# Patient Record
Sex: Male | Born: 1985 | Race: White | Hispanic: No | Marital: Single | Smoking: Current every day smoker
Health system: Southern US, Community
[De-identification: ages and names within clinical notes are randomized; demographics above are authoritative.]

## PROBLEM LIST (undated history)

## (undated) DIAGNOSIS — J45909 Unspecified asthma, uncomplicated: Secondary | ICD-10-CM

---

## 2006-02-11 ENCOUNTER — Emergency Department: Payer: Self-pay

## 2006-02-11 ENCOUNTER — Emergency Department: Payer: Self-pay | Admitting: Emergency Medicine

## 2006-02-20 ENCOUNTER — Emergency Department: Payer: Self-pay | Admitting: Emergency Medicine

## 2006-08-19 ENCOUNTER — Emergency Department: Payer: Self-pay | Admitting: Emergency Medicine

## 2006-08-30 ENCOUNTER — Emergency Department: Payer: Self-pay | Admitting: Emergency Medicine

## 2008-09-29 ENCOUNTER — Emergency Department: Payer: Self-pay | Admitting: Emergency Medicine

## 2008-10-29 ENCOUNTER — Emergency Department: Payer: Self-pay | Admitting: Emergency Medicine

## 2009-03-04 ENCOUNTER — Emergency Department: Payer: Self-pay | Admitting: Emergency Medicine

## 2009-10-12 ENCOUNTER — Emergency Department: Payer: Self-pay | Admitting: Emergency Medicine

## 2009-10-15 ENCOUNTER — Emergency Department: Payer: Self-pay | Admitting: Emergency Medicine

## 2009-10-18 ENCOUNTER — Emergency Department: Payer: Self-pay | Admitting: Internal Medicine

## 2009-10-18 ENCOUNTER — Inpatient Hospital Stay (HOSPITAL_COMMUNITY)
Admission: EM | Admit: 2009-10-18 | Discharge: 2009-10-20 | Payer: Self-pay | Source: Home / Self Care | Admitting: Emergency Medicine

## 2009-10-26 ENCOUNTER — Emergency Department: Payer: Self-pay | Admitting: Emergency Medicine

## 2010-03-09 ENCOUNTER — Emergency Department: Payer: Self-pay | Admitting: Emergency Medicine

## 2010-03-25 LAB — URINE MICROSCOPIC-ADD ON

## 2010-03-25 LAB — CBC
HCT: 36.5 % — ABNORMAL LOW (ref 39.0–52.0)
Hemoglobin: 12.5 g/dL — ABNORMAL LOW (ref 13.0–17.0)
Hemoglobin: 15.5 g/dL (ref 13.0–17.0)
MCH: 30.3 pg (ref 26.0–34.0)
MCV: 88 fL (ref 78.0–100.0)
RBC: 4.15 MIL/uL — ABNORMAL LOW (ref 4.22–5.81)
RBC: 5.11 MIL/uL (ref 4.22–5.81)
RDW: 12.3 % (ref 11.5–15.5)
WBC: 11.2 10*3/uL — ABNORMAL HIGH (ref 4.0–10.5)
WBC: 19.5 10*3/uL — ABNORMAL HIGH (ref 4.0–10.5)

## 2010-03-25 LAB — COMPREHENSIVE METABOLIC PANEL
ALT: 15 U/L (ref 0–53)
AST: 20 U/L (ref 0–37)
Albumin: 3.8 g/dL (ref 3.5–5.2)
Alkaline Phosphatase: 79 U/L (ref 39–117)
CO2: 25 mEq/L (ref 19–32)
Chloride: 105 mEq/L (ref 96–112)
GFR calc Af Amer: >60 mL/min (ref >60)
GFR calc non Af Amer: >60 mL/min (ref >60)
Potassium: 3.9 mEq/L (ref 3.5–5.1)
Sodium: 138 mEq/L (ref 135–145)
Total Bilirubin: 0.4 mg/dL (ref 0.3–1.2)

## 2010-03-25 LAB — URINE CULTURE
Culture  Setup Time: 201110092130
Culture: NO GROWTH

## 2010-03-25 LAB — CULTURE, BLOOD (ROUTINE X 2)
Culture: NO GROWTH
Culture: NO GROWTH

## 2010-03-25 LAB — SEDIMENTATION RATE: Sed Rate: 26 mm/hr — ABNORMAL HIGH (ref 0–16)

## 2010-03-25 LAB — BASIC METABOLIC PANEL
BUN: 7 mg/dL (ref 6–23)
Chloride: 111 mEq/L (ref 96–112)
GFR calc Af Amer: >60 mL/min (ref >60)
GFR calc non Af Amer: >60 mL/min (ref >60)
Potassium: 4.3 mEq/L (ref 3.5–5.1)
Sodium: 142 mEq/L (ref 135–145)

## 2010-03-25 LAB — URINALYSIS, ROUTINE W REFLEX MICROSCOPIC
Ketones, ur: NEGATIVE mg/dL
Nitrite: NEGATIVE
Specific Gravity, Urine: 1.008 (ref 1.005–1.030)
Urobilinogen, UA: 0.2 mg/dL (ref 0.0–1.0)
pH: 7 (ref 5.0–8.0)

## 2010-03-25 LAB — HLA-B27 ANTIGEN

## 2010-03-25 LAB — DIFFERENTIAL
Basophils Absolute: 0 10*3/uL (ref 0.0–0.1)
Eosinophils Absolute: 0.1 10*3/uL (ref 0.0–0.7)
Eosinophils Relative: 1 % (ref 0–5)
Monocytes Absolute: 1.5 10*3/uL — ABNORMAL HIGH (ref 0.1–1.0)

## 2010-03-25 LAB — URIC ACID: Uric Acid, Serum: 5.5 mg/dL (ref 4.0–7.8)

## 2010-09-22 ENCOUNTER — Emergency Department (HOSPITAL_COMMUNITY)
Admission: EM | Admit: 2010-09-22 | Discharge: 2010-09-22 | Disposition: A | Discharge status: Home / Self Care | Payer: Self-pay | Attending: Emergency Medicine | Admitting: Emergency Medicine

## 2010-09-22 DIAGNOSIS — R6883 Chills (without fever): Secondary | ICD-10-CM | POA: Insufficient documentation

## 2010-09-22 DIAGNOSIS — M79609 Pain in unspecified limb: Secondary | ICD-10-CM | POA: Insufficient documentation

## 2010-09-22 DIAGNOSIS — L03119 Cellulitis of unspecified part of limb: Secondary | ICD-10-CM | POA: Insufficient documentation

## 2010-09-22 DIAGNOSIS — R112 Nausea with vomiting, unspecified: Secondary | ICD-10-CM | POA: Insufficient documentation

## 2010-09-22 DIAGNOSIS — L02419 Cutaneous abscess of limb, unspecified: Secondary | ICD-10-CM | POA: Insufficient documentation

## 2010-09-22 DIAGNOSIS — R21 Rash and other nonspecific skin eruption: Secondary | ICD-10-CM | POA: Insufficient documentation

## 2011-08-18 ENCOUNTER — Emergency Department: Payer: Self-pay | Admitting: Emergency Medicine

## 2011-09-16 ENCOUNTER — Emergency Department: Payer: Self-pay | Admitting: Emergency Medicine

## 2011-09-16 IMAGING — CR RIGHT INDEX FINGER 2+V
1 series · 3 of 3 positions shown · non-contrast
Comparison: none

REASON FOR EXAM: laceration
COMMENTS:

PROCEDURE:     DXR - DXR FINGER INDEX 2ND DIGIT RT HA  - [DATE]  [DATE]
RESULT:     Comparison:  None

[Series 1: x finger pa right · 0.14mm/px · 3 of 3 slices shown]
[im 1/3]
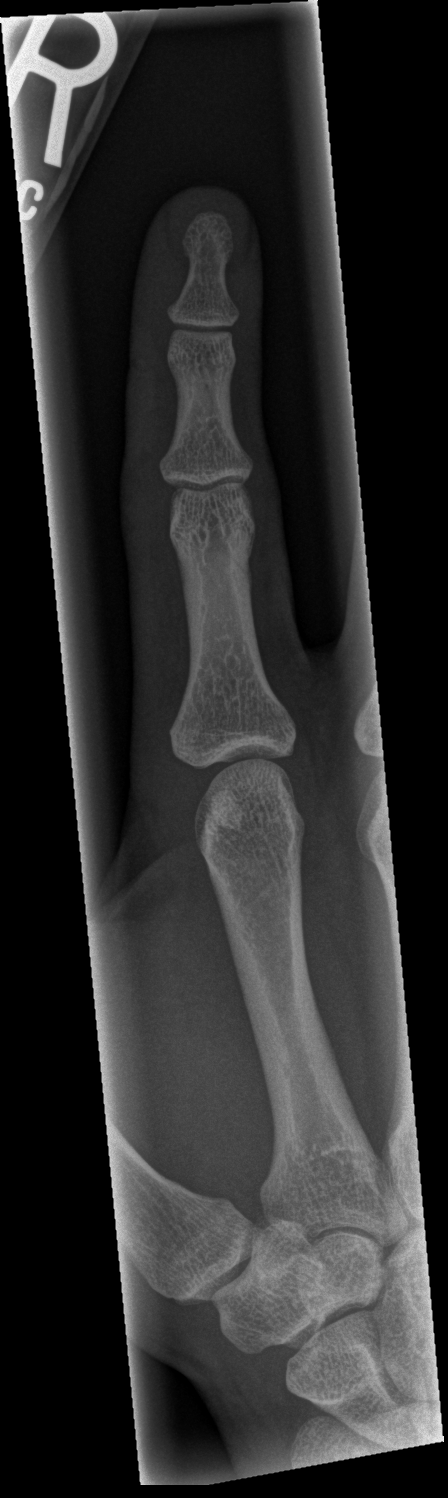
[im 2/3]
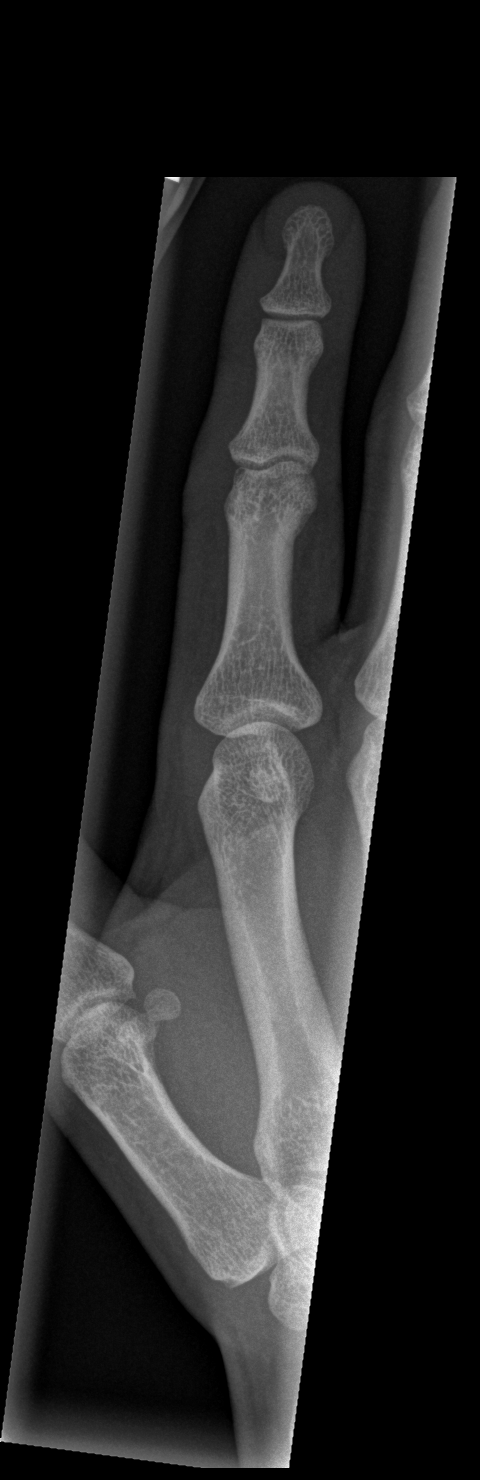
[im 3/3]
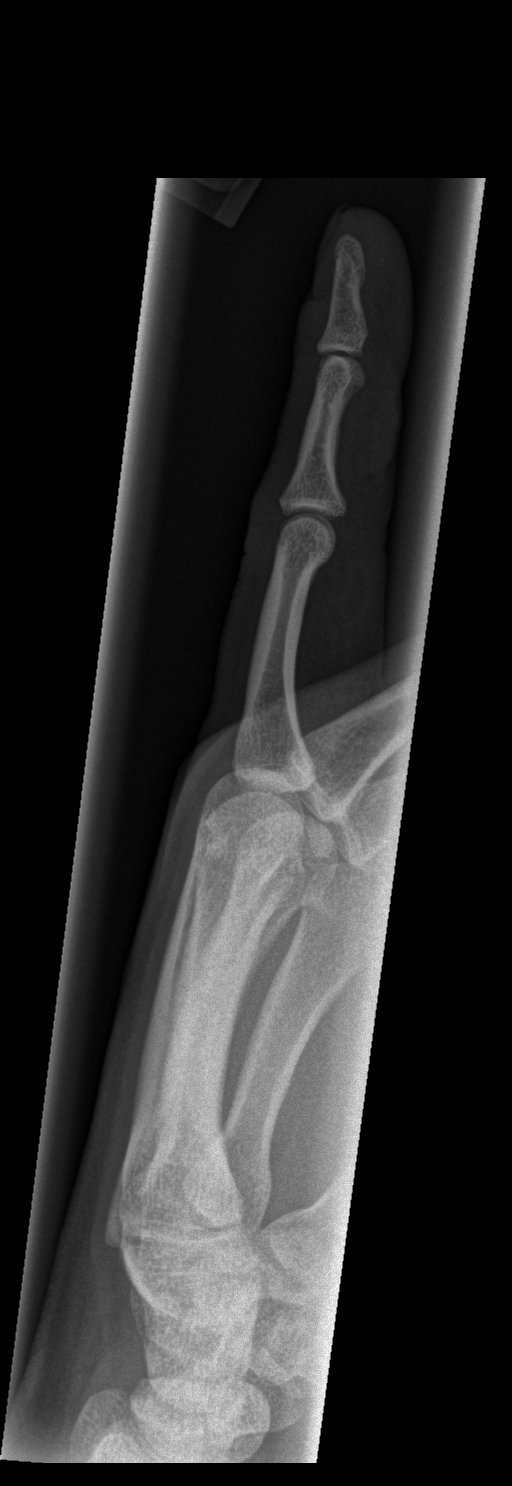

[3 of 3 positions shown; findings below may reference images not displayed]

FINDINGS: Three coned-down views of the right second digit demonstrates no fracture or
dislocation. The soft tissues are normal.
IMPRESSION: No acute osseous injury of the right second digit.

[REDACTED]

## 2014-05-31 ENCOUNTER — Encounter: Payer: Self-pay | Admitting: Emergency Medicine

## 2014-05-31 ENCOUNTER — Emergency Department: Payer: Self-pay

## 2014-05-31 ENCOUNTER — Emergency Department
Admission: EM | Admit: 2014-05-31 | Discharge: 2014-05-31 | Disposition: A | Discharge status: Home / Self Care | Payer: Self-pay | Attending: Emergency Medicine | Admitting: Emergency Medicine

## 2014-05-31 DIAGNOSIS — S62632A Displaced fracture of distal phalanx of right middle finger, initial encounter for closed fracture: Secondary | ICD-10-CM | POA: Insufficient documentation

## 2014-05-31 DIAGNOSIS — Y998 Other external cause status: Secondary | ICD-10-CM | POA: Insufficient documentation

## 2014-05-31 DIAGNOSIS — Z720 Tobacco use: Secondary | ICD-10-CM | POA: Insufficient documentation

## 2014-05-31 DIAGNOSIS — W450XXA Nail entering through skin, initial encounter: Secondary | ICD-10-CM

## 2014-05-31 DIAGNOSIS — S6710XA Crushing injury of unspecified finger(s), initial encounter: Secondary | ICD-10-CM

## 2014-05-31 DIAGNOSIS — S61319A Laceration without foreign body of unspecified finger with damage to nail, initial encounter: Secondary | ICD-10-CM

## 2014-05-31 DIAGNOSIS — S61312A Laceration without foreign body of right middle finger with damage to nail, initial encounter: Secondary | ICD-10-CM | POA: Insufficient documentation

## 2014-05-31 DIAGNOSIS — Y9289 Other specified places as the place of occurrence of the external cause: Secondary | ICD-10-CM | POA: Insufficient documentation

## 2014-05-31 DIAGNOSIS — S62639A Displaced fracture of distal phalanx of unspecified finger, initial encounter for closed fracture: Secondary | ICD-10-CM

## 2014-05-31 DIAGNOSIS — W208XXA Other cause of strike by thrown, projected or falling object, initial encounter: Secondary | ICD-10-CM | POA: Insufficient documentation

## 2014-05-31 DIAGNOSIS — Y9389 Activity, other specified: Secondary | ICD-10-CM | POA: Insufficient documentation

## 2014-05-31 HISTORY — DX: Unspecified asthma, uncomplicated: J45.909

## 2014-05-31 MED ORDER — LIDOCAINE HCL (PF) 1 % IJ SOLN
2.0000 mL | Freq: Once | INTRAMUSCULAR | Status: AC
  Administered 2014-05-31: 2 mL via INTRADERMAL

## 2014-05-31 MED ORDER — HYDROCODONE-ACETAMINOPHEN 5-325 MG PO TABS
1.0000 | ORAL_TABLET | Freq: Once | ORAL | Status: AC
  Administered 2014-05-31: 1 via ORAL

## 2014-05-31 MED ORDER — AMOXICILLIN-POT CLAVULANATE 875-125 MG PO TABS: 1.0000 | ORAL_TABLET | Freq: Two times a day (BID) | ORAL | Status: AC

## 2014-05-31 MED ORDER — LIDOCAINE HCL (PF) 1 % IJ SOLN
INTRAMUSCULAR | Status: AC
  Administered 2014-05-31: 2 mL via INTRADERMAL
  Filled 2014-05-31: qty 5

## 2014-05-31 MED ORDER — HYDROCODONE-ACETAMINOPHEN 5-325 MG PO TABS
ORAL_TABLET | ORAL | Status: AC
  Administered 2014-05-31: 1 via ORAL
  Filled 2014-05-31: qty 1

## 2014-05-31 MED ORDER — HYDROCODONE-ACETAMINOPHEN 5-325 MG PO TABS: 1.0000 | ORAL_TABLET | ORAL | Status: DC | PRN

## 2014-05-31 NOTE — Discharge Instructions (Signed)
Crush Injury, Fingers or Toes A crush injury to the fingers or toes means the tissues have been damaged by being squeezed (compressed). There will be bleeding into the tissues and swelling. Often, blood will collect under the skin. When this happens, the skin on the finger often dies and may slough off (shed) 1 week to 10 days later. Usually, new skin is growing underneath. If the injury has been too severe and the tissue does not survive, the damaged tissue may begin to turn black over several days.  Wounds which occur because of the crushing may be stitched (sutured) shut. However, crush injuries are more likely to become infected than other injuries.These wounds may not be closed as tightly as other types of cuts to prevent infection. Nails involved are often lost. These usually grow back over several weeks.  DIAGNOSIS X-rays may be taken to see if there is any injury to the bones. TREATMENT Broken bones (fractures) may be treated with splinting, depending on the fracture. Often, no treatment is required for fractures of the last bone in the fingers or toes. HOME CARE INSTRUCTIONS   The crushed part should be raised (elevated) above the heart or center of the chest as much as possible for the first several days or as directed. This helps with pain and lessens swelling. Less swelling increases the chances that the crushed part will survive.  Put ice on the injured area.  Put ice in a plastic bag.  Place a towel between your skin and the bag.  Leave the ice on for 15-20 minutes, 03-04 times a day for the first 2 days.  Only take over-the-counter or prescription medicines for pain, discomfort, or fever as directed by your caregiver.  Use your injured part only as directed.  Change your bandages (dressings) as directed.  Keep all follow-up appointments as directed by your caregiver. Not keeping your appointment could result in a chronic or permanent injury, pain, and disability. If there is  any problem keeping the appointment, you must call to reschedule. SEEK IMMEDIATE MEDICAL CARE IF:   There is redness, swelling, or increasing pain in the wound area.  Pus is coming from the wound.  You have a fever.  You notice a bad smell coming from the wound or dressing.  The edges of the wound do not stay together after the sutures have been removed.  You are unable to move the injured finger or toe. MAKE SURE YOU:   Understand these instructions.  Will watch your condition.  Will get help right away if you are not doing well or get worse. Document Released: 12/27/2004 Document Revised: 03/21/2011 Document Reviewed: 05/14/2010 [facility] Patient Information 2015 [place], [place]. This information is not intended to replace advice given to you by your health care provider. Make sure you discuss any questions you have with your health care provider.  Fingernail Removal Fingernails may need to be removed because of injury, infections, or correction of abnormal growth. A special non-stick bandage has been put on your finger tightly to prevent bleeding. Fingernails will usually grow back if the finger has not been badly injured and you carefully follow instructions. HOME CARE INSTRUCTIONS   Keep your hand elevated above your heart to relieve pain and swelling.  Keep your dressing dry and clean.  Change your bandage in 24 hours.  After your bandage is changed, soak your hand in warm soapy water for 10 to 20 minutes. Do this 3 times per day. This helps reduce pain and  swelling. After soaking your hand, apply a clean, dry bandage. Change your bandage if it is wet or dirty.  Only take over-the-counter or prescription medicines for pain, discomfort, or fever as directed by your caregiver.  See your caregiver as needed for problems.  You may have received an instruction to follow up with your caregiver or a specialist. The failure to follow up as instructed could result in the permanent  loss of a fingernail. SEEK IMMEDIATE MEDICAL CARE IF:   You have increased pain, swelling, drainage, or bleeding.  You have a fever. MAKE SURE YOU:   Understand these instructions.  Will watch your condition.  Will get help right away if you are not doing well or get worse. Document Released: 12/25/1999 Document Revised: 03/21/2011 Document Reviewed: 05/01/2007 [facility] Patient Information 2015 [place], [place]. This information is not intended to replace advice given to you by your health care provider. Make sure you discuss any questions you have with your health care provider.

## 2014-05-31 NOTE — ED Notes (Signed)
Patient to ED with c/o mashing right 3rd digit with wood yesterday afternoon. Patient reports continued bleeding and nail is barely hanging on. Patient also c/o possible abscess to right upper jaw.

## 2014-05-31 NOTE — ED Provider Notes (Signed)
CSN: [id number]     Arrival date & time 05/31/14  1817 History   First MD Initiated Contact with Patient 05/31/14 1944     Chief Complaint  Patient presents with  . Finger Injury     (Consider location/radiation/quality/duration/timing/severity/associated sxs/prior Treatment) HPI 29 year old male presents for evaluation of right third digit pain. At approximately 11:50 AM 1 day ago, patient had a heavy piece of wood fall and land on his right third digit. Patient dressed the tip of the right third digit with a gauze and presents today for evaluation. The patient has moderate pain to the dorsal aspect of the right third digit. Mild laceration at the base of the nail. The proximal nail is elevated out of the nail bed. Patient has been keeping the digit clean and dry. He states he did not come in sooner due to graduation yesterday. His tetanus is up-to-date, less than 5 years. He denies any numbness or tingling.   Past Medical History  Diagnosis Date  . Asthma    History reviewed. No pertinent past surgical history. History reviewed. No pertinent family history. History  Substance Use Topics  . Smoking status: Current Every Day Smoker  . Smokeless tobacco: Not on file  . Alcohol Use: Not on file    Review of Systems  Constitutional: Negative.  Negative for fever, chills, activity change and appetite change.  HENT: Negative for congestion, ear pain, mouth sores, rhinorrhea, sinus pressure, sore throat and trouble swallowing.   Eyes: Negative for photophobia, pain and discharge.  Respiratory: Negative for cough, chest tightness and shortness of breath.   Cardiovascular: Negative for chest pain and leg swelling.  Gastrointestinal: Negative for nausea, vomiting, abdominal pain, diarrhea and abdominal distention.  Genitourinary: Negative for dysuria and difficulty urinating.  Musculoskeletal: Positive for joint swelling. Negative for back pain, arthralgias and gait problem.  Skin:  Positive for wound. Negative for color change and rash.  Neurological: Negative for dizziness and headaches.  Hematological: Negative for adenopathy.  Psychiatric/Behavioral: Negative for behavioral problems and agitation.      Allergies  Review of patient's allergies indicates no known allergies.  Home Medications   Prior to Admission medications   Medication Sig Start Date End Date Taking? Authorizing Provider  amoxicillin-clavulanate (AUGMENTIN) 875-125 MG per tablet Take 1 tablet by mouth every 12 (twelve) hours. 05/31/14 06/10/14  Duanne Guess, PA-C  HYDROcodone-acetaminophen (NORCO) 5-325 MG per tablet Take 1 tablet by mouth every 4 (four) hours as needed for moderate pain. 05/31/14   Duanne Guess, PA-C   BP 138/73 mmHg  Pulse 63  Temp(Src) 97.7 F (36.5 C) (Oral)  Resp 18  Ht 5\' 10"  (1.778 m)  Wt 195 lb (88.451 kg)  BMI 27.98 kg/m2  SpO2 100% Physical Exam  Constitutional: He is oriented to person, place, and time. He appears well-developed and well-nourished.  HENT:  Head: Normocephalic and atraumatic.  Eyes: Conjunctivae and EOM are normal. Pupils are equal, round, and reactive to light.  Neck: Normal range of motion. Neck supple.  Cardiovascular: Normal rate, regular rhythm, normal heart sounds and intact distal pulses.   Pulmonary/Chest: Effort normal and breath sounds normal.  Abdominal: Soft. Bowel sounds are normal.  Musculoskeletal: Normal range of motion. He exhibits tenderness (dorsal aspect of right third distal phalanx). He exhibits no edema.  Neurological: He is alert and oriented to person, place, and time. No cranial nerve deficit.  Skin: Skin is warm and dry.  Laceration at the base of the nail right  third digit. This is non-gaping. Superficial. Approximately one centimeters. Proximal ulnar aspect of the base of the nail is protruding. Mild subungual hematoma present to the right third nail  Psychiatric: He has a normal mood and affect. His behavior  is normal. Judgment and thought content normal.    ED Course  Procedures (including critical care time) NERVE BLOCK Performed by: Feliberto Gottron Consent: Verbal consent obtained. Required items: required blood products, implants, devices, and special equipment available Time out: Immediately prior to procedure a "time out" was called to verify the correct patient, procedure, equipment, support staff and site/side marked as required.  Indication:  Nail repair right third digit  Nerve block body site: Right third digit   Preparation: Patient was prepped and draped in the usual sterile fashion. Needle gauge: 24 G Location technique: anatomical landmarks  Local anesthetic: 1% lidocaine without epi   Anesthetic total: 2 ml  Outcome: pain improved Patient tolerance: Patient tolerated the procedure well with no immediate complications.  LACERATION REPAIR Performed by: Feliberto Gottron Authorized by: Feliberto Gottron Consent: Verbal consent obtained. Risks and benefits: risks, benefits and alternatives were discussed Consent given by: patient Patient identity confirmed: provided demographic data Prepped and Draped in normal sterile fashion Wound explored  Laceration Location: Right third nail bed  Laceration Length: 2 cm   No Foreign Bodies seen or palpated  Anesthesia: local infiltration  Local anesthetic: lidocaine 1% % without epinephrine digital block Anesthetic total: 2 ml  Irrigation method: syringe Amount of cleaning: standard  Skin closure: 2 4-0 Vicryl   Number of sutures: 4-0 Vicryl Technique:  simple interrupted   Patient tolerance: Patient tolerated the procedure well with no immediate complications. patient's nail was initially partially removed, this was fully removed during the procedure. It was necessary to remove the nail to repair the nail bed.   Labs Review Labs Reviewed - No data to display  Imaging Review Dg  Finger Middle Right  05/31/2014   CLINICAL DATA:  Trauma to distal 3rd digit  EXAM: RIGHT MIDDLE FINGER 2+V  COMPARISON:  None.  FINDINGS: Comminuted 3rd distal tuft fracture.  The joint spaces are preserved.  Mild soft tissue swelling.  IMPRESSION: Comminuted 3rd distal tuft fracture.   Electronically Signed   By: Julian Hy M.D.   On: 05/31/2014 20:19     EKG Interpretation None      MDM   Final diagnoses:  Crush injury to finger, initial encounter  Nail, injury by, initial encounter  Closed fracture of tuft of distal phalanx of finger, initial encounter  Laceration of nail bed of finger, initial encounter     29 year old male status post right third digit crush injury one day ago. He suffered a laceration and partial nail removal to the right third digit. X-ray showed tuft fracture with minimal displacement. Patient underwent digital block, nail removal, laceration of the nailbed. His tetanus is up-to-date. He was started on Augmentin. Dressing was applied. He will follow-up with orthopedics in 7 days for recheck.    Duanne Guess, PA-C 05/31/14 2047  Duanne Guess, PA-C 05/31/14 2048  Orbie Pyo, MD 06/01/14 [phone]

## 2014-05-31 NOTE — ED Notes (Addendum)
Pt reports a log rolled and crushed his right third finger against the bed of the truck yesterday. Finger nail remains intact but swelling present under the nail and cuticle causing tissue under the nail to push between the cuticle and the nail.

## 2015-04-15 ENCOUNTER — Emergency Department
Admission: EM | Admit: 2015-04-15 | Discharge: 2015-04-15 | Disposition: A | Discharge status: Home / Self Care | Payer: Self-pay | Attending: Emergency Medicine | Admitting: Emergency Medicine

## 2015-04-15 ENCOUNTER — Encounter: Payer: Self-pay | Admitting: Medical Oncology

## 2015-04-15 DIAGNOSIS — F17200 Nicotine dependence, unspecified, uncomplicated: Secondary | ICD-10-CM | POA: Insufficient documentation

## 2015-04-15 DIAGNOSIS — J45909 Unspecified asthma, uncomplicated: Secondary | ICD-10-CM | POA: Insufficient documentation

## 2015-04-15 DIAGNOSIS — J069 Acute upper respiratory infection, unspecified: Secondary | ICD-10-CM | POA: Insufficient documentation

## 2015-04-15 DIAGNOSIS — Z20828 Contact with and (suspected) exposure to other viral communicable diseases: Secondary | ICD-10-CM

## 2015-04-15 MED ORDER — CHLORPHENIRAMINE MALEATE 4 MG PO TABS: 4.0000 mg | ORAL_TABLET | Freq: Two times a day (BID) | ORAL | Status: DC | PRN

## 2015-04-15 MED ORDER — OSELTAMIVIR PHOSPHATE 75 MG PO CAPS: 75.0000 mg | ORAL_CAPSULE | Freq: Every day | ORAL | Status: DC

## 2015-04-15 NOTE — ED Provider Notes (Signed)
[facility] Emergency Department Provider Note  ____________________________________________  Time seen: Approximately 9:45 AM  I have reviewed the triage vital signs and the nursing notes.   HISTORY  Chief Complaint Cough and Sore Throat    HPI MUSTAF FOUTCH is a 30 y.o. male presents with complaints of runny nose and throat and cough since yesterday. Patient denies any fever chills nausea vomiting. States appetite okay. Has not taken any over-the-counter medications treat the symptoms. Daughter diagnosed with a positive flu and is started on Tamiflu. Patient would like to start Tamiflu prophylactically since with runny nose. denies any other complaints at this time.   Past Medical History  Diagnosis Date  . Asthma     There are no active problems to display for this patient.   History reviewed. No pertinent past surgical history.  Current Outpatient Rx  Name  Route  Sig  Dispense  Refill  . chlorpheniramine (CHLOR-TRIMETON) 4 MG tablet   Oral   Take 1 tablet (4 mg total) by mouth 2 (two) times daily as needed for allergies or rhinitis.   30 tablet   0   . oseltamivir (TAMIFLU) 75 MG capsule   Oral   Take 1 capsule (75 mg total) by mouth daily.   10 capsule   0     Allergies Review of patient's allergies indicates no known allergies.  No family history on file.  Social History Social History  Substance Use Topics  . Smoking status: Current Every Day Smoker  . Smokeless tobacco: None  . Alcohol Use: No    Review of Systems Constitutional: No fever/chills ENT: Positive and cheek, sore throat. Cardiovascular: Denies chest pain. Respiratory: Denies shortness of breath. Positive for cough. Musculoskeletal: Negative for back pain. Skin: Negative for rash. Neurological: Negative for headaches, focal weakness or numbness.  10-point ROS otherwise negative.  ____________________________________________   PHYSICAL EXAM:  VITAL  SIGNS: ED Triage Vitals  Enc Vitals Group     BP 04/15/15 0939 119/61 mmHg     Pulse Rate 04/15/15 0939 60     Resp 04/15/15 0939 16     Temp 04/15/15 0939 98 F (36.7 C)     Temp Source 04/15/15 0939 Oral     SpO2 04/15/15 0939 100 %     Weight 04/15/15 0939 170 lb (77.111 kg)     Height 04/15/15 0939 5\' 10"  (1.778 m)     Head Cir --      Peak Flow --      Pain Score --      Pain Loc --      Pain Edu? --      Excl. in [place]? --     Constitutional: Alert and oriented. Well appearing and in no acute distress. Head: Atraumatic. Nose: Positive congestion/rhinnorhea. Mouth/Throat: Mucous membranes are moist.  Oropharynx non-erythematous. Neck: No stridor. No adenopathy, full range of motion nontender.  Cardiovascular: Normal rate, regular rhythm. Grossly normal heart sounds.  Good peripheral circulation. Respiratory: Normal respiratory effort.  No retractions. Lungs CTAB. Musculoskeletal: No lower extremity tenderness nor edema.  No joint effusions. Neurologic:  Normal speech and language. No gross focal neurologic deficits are appreciated. No gait instability. Skin:  Skin is warm, dry and intact. No rash noted. Psychiatric: Mood and affect are normal. Speech and behavior are normal.  ____________________________________________   LABS (all labs ordered are listed, but only abnormal results are displayed)  Labs Reviewed - No data to display ____________________________________________   PROCEDURES  Procedure(s) performed: None  Critical Care performed: No  ____________________________________________   INITIAL IMPRESSION / ASSESSMENT AND PLAN / ED COURSE  Pertinent labs & imaging results that were available during my care of the patient were reviewed by me and considered in my medical decision making (see chart for details).  Upper respiratory infection with exposure to influenza. Rx given for Tamiflu 75 mg , chlorpheniramine. Patient follow-up PCP or return to ER with  any worsening symptomology.  ____________________________________________   FINAL CLINICAL IMPRESSION(S) / ED DIAGNOSES  Final diagnoses:  URI, acute  Exposure to influenza     This chart was dictated using voice recognition software/Dragon. Despite best efforts to proofread, errors can occur which can change the meaning. Any change was purely unintentional.   Arlyss Repress, PA-C 04/15/15 1000

## 2015-04-15 NOTE — Discharge Instructions (Signed)
Viral Infections °A viral infection can be caused by different types of viruses. Most viral infections are not serious and resolve on their own. However, some infections may cause severe symptoms and may lead to further complications. °SYMPTOMS °Viruses can frequently cause: °· Minor sore throat. °· Aches and pains. °· Headaches. °· Runny nose. °· Different types of rashes. °· Watery eyes. °· Tiredness. °· Cough. °· Loss of appetite. °· Gastrointestinal infections, resulting in nausea, vomiting, and diarrhea. °These symptoms do not respond to antibiotics because the infection is not caused by bacteria. However, you might catch a bacterial infection following the viral infection. This is sometimes called a "superinfection." Symptoms of such a bacterial infection may include: °· Worsening sore throat with pus and difficulty swallowing. °· Swollen neck glands. °· Chills and a high or persistent fever. °· Severe headache. °· Tenderness over the sinuses. °· Persistent overall ill feeling (malaise), muscle aches, and tiredness (fatigue). °· Persistent cough. °· Yellow, green, or brown mucus production with coughing. °HOME CARE INSTRUCTIONS  °· Only take over-the-counter or prescription medicines for pain, discomfort, diarrhea, or fever as directed by your caregiver. °· Drink enough water and fluids to keep your urine clear or pale yellow. Sports drinks can provide valuable electrolytes, sugars, and hydration. °· Get plenty of rest and maintain proper nutrition. Soups and broths with crackers or rice are fine. °SEEK IMMEDIATE MEDICAL CARE IF:  °· You have severe headaches, shortness of breath, chest pain, neck pain, or an unusual rash. °· You have uncontrolled vomiting, diarrhea, or you are unable to keep down fluids. °· You or your child has an oral temperature above 102° F (38.9° C), not controlled by medicine. °· Your baby is older than 3 months with a rectal temperature of 102° F (38.9° C) or higher. °· Your baby is 3  months old or younger with a rectal temperature of 100.4° F (38° C) or higher. °MAKE SURE YOU:  °· Understand these instructions. °· Will watch your condition. °· Will get help right away if you are not doing well or get worse. °  °This information is not intended to replace advice given to you by your health care provider. Make sure you discuss any questions you have with your health care provider. °  °Document Released: 10/06/2004 Document Revised: 03/21/2011 Document Reviewed: 06/04/2014 °Elsevier Interactive Patient Education ©2016 [employer] ° °

## 2015-04-15 NOTE — ED Notes (Signed)
Pt reports runny nose, itchy throat and cough since yesterday.

## 2015-04-15 NOTE — ED Notes (Signed)
See triage note   Runny nose   Scratchy throat and cough since yesterday  Afebrile on arrival to ed

## 2016-06-22 DIAGNOSIS — Y990 Civilian activity done for income or pay: Secondary | ICD-10-CM | POA: Insufficient documentation

## 2016-06-22 DIAGNOSIS — W208XXA Other cause of strike by thrown, projected or falling object, initial encounter: Secondary | ICD-10-CM | POA: Insufficient documentation

## 2016-06-22 DIAGNOSIS — F17200 Nicotine dependence, unspecified, uncomplicated: Secondary | ICD-10-CM | POA: Insufficient documentation

## 2016-06-22 DIAGNOSIS — Y9259 Other trade areas as the place of occurrence of the external cause: Secondary | ICD-10-CM | POA: Insufficient documentation

## 2016-06-22 DIAGNOSIS — T1502XA Foreign body in cornea, left eye, initial encounter: Secondary | ICD-10-CM | POA: Insufficient documentation

## 2016-06-22 DIAGNOSIS — Y9389 Activity, other specified: Secondary | ICD-10-CM | POA: Insufficient documentation

## 2016-06-22 NOTE — ED Notes (Signed)
Pt states that he received an eye injury Yesterday at work and would like to file a workers comp claim but "I don't know my boss's number because I just started" First Nurse Lattie Haw) notified and attempted to find pt employer's WC profile but was unsuccessful, FN Lattie Haw completed wc ineligibility form and provided pt with it.

## 2016-06-22 NOTE — ED Notes (Signed)
Both eyes: 20/30 Left eye: 20/40 Right eye: 20/50

## 2016-06-22 NOTE — ED Triage Notes (Signed)
Pt presents to ED via POV with c/o LEFT eye injury with possible foreign body that happened yesterday. 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 Pt reports pain is currently 7/10. Pt does not wear contacts.

## 2016-06-23 ENCOUNTER — Emergency Department
Admission: EM | Admit: 2016-06-23 | Discharge: 2016-06-23 | Disposition: A | Discharge status: Home / Self Care | Payer: Self-pay | Attending: Student in an Organized Health Care Education/Training Program | Admitting: Student in an Organized Health Care Education/Training Program

## 2016-06-23 DIAGNOSIS — H5712 Ocular pain, left eye: Secondary | ICD-10-CM

## 2016-06-23 DIAGNOSIS — H18892 Other specified disorders of cornea, left eye: Secondary | ICD-10-CM

## 2016-06-23 MED ORDER — FLUORESCEIN SODIUM 0.6 MG OP STRP
1.0000 | ORAL_STRIP | Freq: Once | OPHTHALMIC | Status: AC
  Administered 2016-06-23: 1 via OPHTHALMIC

## 2016-06-23 MED ORDER — HYDROCODONE-ACETAMINOPHEN 5-325 MG PO TABS: 1.0000 | ORAL_TABLET | ORAL | 0 refills | Status: DC | PRN

## 2016-06-23 MED ORDER — TETANUS-DIPHTH-ACELL PERTUSSIS 5-2.5-18.5 LF-MCG/0.5 IM SUSP
0.5000 mL | Freq: Once | INTRAMUSCULAR | Status: AC
  Administered 2016-06-23: 0.5 mL via INTRAMUSCULAR

## 2016-06-23 MED ORDER — POLYMYXIN B-TRIMETHOPRIM 10000-0.1 UNIT/ML-% OP SOLN
1.0000 [drp] | OPHTHALMIC | Status: DC
  Administered 2016-06-23: 1 [drp] via OPHTHALMIC
  Filled 2016-06-23: qty 10

## 2016-06-23 MED ORDER — BACITRACIN-POLYMYXIN B 500-10000 UNIT/GM OP OINT: 1.0000 "application " | TOPICAL_OINTMENT | Freq: Three times a day (TID) | OPHTHALMIC | 0 refills | Status: DC

## 2016-06-23 MED ORDER — TETRACAINE HCL 0.5 % OP SOLN
2.0000 [drp] | Freq: Once | OPHTHALMIC | Status: AC
  Administered 2016-06-23: 2 [drp] via OPHTHALMIC

## 2016-06-23 MED ORDER — BACITRACIN-POLYMYXIN B 500-10000 UNIT/GM OP OINT: TOPICAL_OINTMENT | Freq: Two times a day (BID) | OPHTHALMIC | Status: DC

## 2016-06-23 NOTE — Discharge Instructions (Signed)
Please call for appt with ophthalmology tomorrow AM for removal of remainder of rust ring.

## 2016-06-23 NOTE — ED Provider Notes (Signed)
[facility] Emergency Department Provider Note    First MD Initiated Contact with Patient 06/23/16 0250     (approximate)  I have reviewed the triage vital signs and the nursing notes.   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 States he thinks that he was grinding some aluminum yesterday and got a speck in his left eye. States that he didn't feel any initial pain but has had worsening pain today and noted redness. States he was wearing protective glasses. Denies any fevers. No other injury or pain.   Past Medical History:  Diagnosis Date  . Asthma    No family history on file. History reviewed. No pertinent surgical history. There are no active problems to display for this patient.     Prior to Admission medications   Medication Sig Start Date End Date Taking? Authorizing Provider  chlorpheniramine (CHLOR-TRIMETON) 4 MG tablet Take 1 tablet (4 mg total) by mouth 2 (two) times daily as needed for allergies or rhinitis. 04/15/15   Beers, Pierce Crane, PA-C  oseltamivir (TAMIFLU) 75 MG capsule Take 1 capsule (75 mg total) by mouth daily. 04/15/15   Beers, Pierce Crane, PA-C    Allergies Patient has no known allergies.    Social History Social History  Substance Use Topics  . Smoking status: Current Every Day Smoker  . Smokeless tobacco: Never Used  . Alcohol use No    Review of Systems Patient denies headaches, rhinorrhea, blurry vision, numbness, shortness of breath, chest pain, edema, cough, abdominal pain, nausea, vomiting, diarrhea, dysuria, fevers, rashes or hallucinations unless otherwise stated above in HPI. ____________________________________________   PHYSICAL EXAM:  VITAL SIGNS: Vitals:   06/22/16 2340  BP: 116/70  Pulse: 83  Resp: 18  Temp: 98.7 F (37.1 C)    Constitutional:  Alert and oriented. Well appearing and in no acute distress. Eyes: left with with no snellens lines, + metalic foreign body andrust right at 5o clock from pupil, no ulceration,  Head: Atraumatic. Nose: No congestion/rhinnorhea. Mouth/Throat: Mucous membranes are moist.   Neck: No stridor. Painless ROM.  Cardiovascular: Normal rate, regular rhythm. Grossly normal heart sounds.  Good peripheral circulation. Respiratory: Normal respiratory effort.  No retractions. Lungs CTAB. Gastrointestinal: Soft and nontender.  Musculoskeletal: No lower extremity tenderness nor edema.  No joint effusions. Neurologic:  Normal speech and language. No gross focal neurologic deficits are appreciated. No facial droop Skin:  Skin is warm, dry and intact. No rash noted. Psychiatric: Mood and affect are normal. Speech and behavior are normal.  ____________________________________________   LABS (all labs ordered are listed, but only abnormal results are displayed)  No results found for this or any previous visit (from the past 24 hour(s)). ____________________________________________  E____________________________________________   PROCEDURES  Procedure(s) performed:  .Foreign Body Removal Date/Time: 06/23/2016 4:07 AM Performed by: Merlyn Lot Authorized by: Merlyn Lot  Consent: Verbal consent obtained. Consent given by: patient Body area: eye Location details: left cornea  Anesthesia: Local Anesthetic: tetracaine drops Localization method: visualized Removal mechanism: 30 gauge. Eye examined with fluorescein. No fluorescein uptake. Residual rust ring present. Dressing: antibiotic drops Depth: superficial Complexity: simple 1 objects recovered. Objects recovered: metalic piece Post-procedure assessment: foreign body removed Patient tolerance: Patient tolerated the procedure well with no immediate complications      Critical Care performed:  no ____________________________________________  INITIAL IMPRESSION / ASSESSMENT AND PLAN / ED COURSE  Pertinent labs & imaging results that were available during my care of the patient were reviewed by me and considered in my medical decision making (see chart for details).  DDX: fb, ulcer, abrasion, rupture  John Wolfe is a 31 y.o. who presents to the ED with metallic foreign body is described above with persistent rust ring. No evidence of globe rupture. Metallic foreign body removed as above but does have a small amount of persistent rust ring will require further management by ophthalmology. Visual acuity is intact. Will be started on antibiotic ointment and given pain medication.  Have discussed with the patient and available family all diagnostics and treatments performed thus far and all questions were answered to the best of my ability. The patient demonstrates understanding and agreement with plan.       ____________________________________________   FINAL CLINICAL IMPRESSION(S) / ED DIAGNOSES  Final diagnoses:  Corneal rust ring of left eye  Eye pain, left      NEW MEDICATIONS STARTED DURING THIS VISIT:  New Prescriptions   No medications on file     Note:  This document was prepared using Dragon voice recognition software and may include unintentional dictation errors.    Merlyn Lot, MD 06/23/16 [phone]

## 2017-02-28 ENCOUNTER — Emergency Department
Admission: EM | Admit: 2017-02-28 | Discharge: 2017-03-01 | Disposition: A | Discharge status: Other Acute Inpatient Hospital | Payer: Self-pay | Attending: Emergency Medicine | Admitting: Emergency Medicine

## 2017-02-28 ENCOUNTER — Encounter: Payer: Self-pay | Admitting: Emergency Medicine

## 2017-02-28 DIAGNOSIS — F1420 Cocaine dependence, uncomplicated: Secondary | ICD-10-CM

## 2017-02-28 DIAGNOSIS — Z79899 Other long term (current) drug therapy: Secondary | ICD-10-CM | POA: Insufficient documentation

## 2017-02-28 DIAGNOSIS — J45909 Unspecified asthma, uncomplicated: Secondary | ICD-10-CM | POA: Insufficient documentation

## 2017-02-28 DIAGNOSIS — F329 Major depressive disorder, single episode, unspecified: Secondary | ICD-10-CM | POA: Insufficient documentation

## 2017-02-28 DIAGNOSIS — Z046 Encounter for general psychiatric examination, requested by authority: Secondary | ICD-10-CM | POA: Insufficient documentation

## 2017-02-28 DIAGNOSIS — F17200 Nicotine dependence, unspecified, uncomplicated: Secondary | ICD-10-CM | POA: Insufficient documentation

## 2017-02-28 DIAGNOSIS — R45851 Suicidal ideations: Secondary | ICD-10-CM

## 2017-02-28 DIAGNOSIS — F1994 Other psychoactive substance use, unspecified with psychoactive substance-induced mood disorder: Secondary | ICD-10-CM

## 2017-02-28 DIAGNOSIS — F419 Anxiety disorder, unspecified: Secondary | ICD-10-CM | POA: Insufficient documentation

## 2017-02-28 LAB — COMPREHENSIVE METABOLIC PANEL
ALK PHOS: 80 U/L (ref 38–126)
ALT: 12 U/L — AB (ref 17–63)
AST: 20 U/L (ref 15–41)
Albumin: 4.6 g/dL (ref 3.5–5.0)
Anion gap: 8 (ref 5–15)
BILIRUBIN TOTAL: 0.9 mg/dL (ref 0.3–1.2)
BUN: 6 mg/dL (ref 6–20)
CALCIUM: 9.7 mg/dL (ref 8.9–10.3)
CHLORIDE: 104 mmol/L (ref 101–111)
CO2: 27 mmol/L (ref 22–32)
CREATININE: 0.86 mg/dL (ref 0.61–1.24)
Glucose, Bld: 96 mg/dL (ref 65–99)
Potassium: 4.6 mmol/L (ref 3.5–5.1)
Sodium: 139 mmol/L (ref 135–145)
Total Protein: 8.8 g/dL — ABNORMAL HIGH (ref 6.5–8.1)

## 2017-02-28 LAB — CBC
HCT: 44.9 % (ref 40.0–52.0)
Hemoglobin: 14.8 g/dL (ref 13.0–18.0)
MCH: 29 pg (ref 26.0–34.0)
MCHC: 32.9 g/dL (ref 32.0–36.0)
MCV: 88.1 fL (ref 80.0–100.0)
PLATELETS: 289 10*3/uL (ref 150–440)
RBC: 5.1 MIL/uL (ref 4.40–5.90)
RDW: 14.6 % — ABNORMAL HIGH (ref 11.5–14.5)
WBC: 8.4 10*3/uL (ref 3.8–10.6)

## 2017-02-28 LAB — URINE DRUG SCREEN, QUALITATIVE (ARMC ONLY)
Amphetamines, Ur Screen: NOT DETECTED
BARBITURATES, UR SCREEN: NOT DETECTED
Benzodiazepine, Ur Scrn: NOT DETECTED
CANNABINOID 50 NG, UR ~~LOC~~: POSITIVE — AB
Cocaine Metabolite,Ur ~~LOC~~: POSITIVE — AB
MDMA (ECSTASY) UR SCREEN: NOT DETECTED
Methadone Scn, Ur: NOT DETECTED
Opiate, Ur Screen: NOT DETECTED
PHENCYCLIDINE (PCP) UR S: NOT DETECTED
Tricyclic, Ur Screen: NOT DETECTED

## 2017-02-28 LAB — SALICYLATE LEVEL: Salicylate Lvl: <7 mg/dL (ref 2.8–30.0)

## 2017-02-28 LAB — ACETAMINOPHEN LEVEL: Acetaminophen (Tylenol), Serum: <10 ug/mL — ABNORMAL LOW (ref 10–30)

## 2017-02-28 LAB — ETHANOL

## 2017-02-28 MED ORDER — HYDROXYZINE HCL 25 MG PO TABS: 50.0000 mg | ORAL_TABLET | ORAL | Status: DC | PRN

## 2017-02-28 NOTE — ED Notes (Signed)
Hourly rounding reveals patient sleeping room. No complaints, stable, in no acute distress. Q15 minute rounds and monitoring via [employer] to continue.

## 2017-02-28 NOTE — ED Notes (Signed)
Tearful during assessment - tissue provided

## 2017-02-28 NOTE — Consult Note (Signed)
[facility] Consult   Reason for Consult: Consult for 32 year old man brought in under IVC through [facility] Referring Physician: Archie Balboa Patient Identification: John Wolfe MRN:  [id number] Principal Diagnosis: Substance induced mood disorder [facility]) Diagnosis:   Patient Active Problem List   Diagnosis Date Noted  . Substance induced mood disorder ([place]) [F19.94] 02/28/2017  . Suicidal ideation [R45.851] 02/28/2017  . Cocaine abuse ([place]) [F14.10] 02/28/2017    Total Time spent with patient: 1 hour  Subjective:   John Wolfe is a 31 y.o. male patient admitted with "my mom is an idiot".  HPI: Patient seen and interviewed chart reviewed.  Reviewed referral paperwork from [place].  32 year old man brought in under IVC.  Police initially called by his family because of concerns that he was having suicidal ideation or behavior.  Apparently he had made some comments to his family about saying goodbye to all of them and later they saw him parked on a bridge.  Patient appeared to be sending signals to his family about suicidal thinking.  The police took the patient to [place] where it seems like he was probably about partially cooperative but did not completely denies suicidal ideation.  Patient apparently has been on a cocaine binge.  He is not giving me much information about how long this is been going on but it has resulted in him being separated from his wife losing his job not having any place to stay currently.  He is not getting any outpatient mental health treatment.  Social history: Patient had previously been employed but apparently has lost his job now.  Is separated.  According to her the referral paperwork has no real stable place to live.  He describes himself as "I roam the streets at night"  Medical history: Mild asthma no other significant medical problems  Substance abuse history: Apparently has been abusing cocaine although how long this is been going on and whether he  has had any prior treatment at all is unclear.  Patient is not a very cooperative historian.  Past Psychiatric History: No prior psychiatric treatment or evaluation.  No history of psychiatric hospitalization.  No known history of suicide attempts.  Risk to Self: Is patient at risk for suicide?: No Risk to Others:   Prior Inpatient Therapy:   Prior Outpatient Therapy:    Past Medical History:  Past Medical History:  Diagnosis Date  . Asthma    History reviewed. No pertinent surgical history. Family History: No family history on file. Family Psychiatric  History: Patient does not answer this question Social History:  Social History   Substance and Sexual Activity  Alcohol Use No     Social History   Substance and Sexual Activity  Drug Use Not on file    Social History   Socioeconomic History  . Marital status: Single    Spouse name: None  . Number of children: None  . Years of education: None  . Highest education level: None  Social Needs  . Financial resource strain: None  . Food insecurity - worry: None  . Food insecurity - inability: None  . Transportation needs - medical: None  . Transportation needs - non-medical: None  Occupational History  . None  Tobacco Use  . Smoking status: Current Every Day Smoker  . Smokeless tobacco: Never Used  Substance and Sexual Activity  . Alcohol use: No  . Drug use: None  . Sexual activity: None  Other Topics Concern  . None  Social  History Narrative  . None   Additional Social History:    Allergies:  No Known Allergies  Labs:  Results for orders placed or performed during the hospital encounter of 02/28/17 (from the past 48 hour(s))  Comprehensive metabolic panel     Status: Abnormal   Collection Time: 02/28/17  4:50 PM  Result Value Ref Range   Sodium 139 135 - 145 mmol/L   Potassium 4.6 3.5 - 5.1 mmol/L   Chloride 104 101 - 111 mmol/L   CO2 27 22 - 32 mmol/L   Glucose, Bld 96 65 - 99 mg/dL   BUN 6 6 - 20  mg/dL   Creatinine, Ser 0.86 0.61 - 1.24 mg/dL   Calcium 9.7 8.9 - 10.3 mg/dL   Total Protein 8.8 (H) 6.5 - 8.1 g/dL   Albumin 4.6 3.5 - 5.0 g/dL   AST 20 15 - 41 U/L   ALT 12 (L) 17 - 63 U/L   Alkaline Phosphatase 80 38 - 126 U/L   Total Bilirubin 0.9 0.3 - 1.2 mg/dL   GFR calc non Af Amer >60 >60 mL/min   GFR calc Af Amer >60 >60 mL/min    Comment: (NOTE) The eGFR has been calculated using the CKD EPI equation. This calculation has not been validated in all clinical situations. eGFR's persistently <60 mL/min signify possible Chronic Kidney Disease.    Anion gap 8 5 - 15    Comment: Performed at [facility], [address] [place], [place] [address]  Ethanol     Status: None   Collection Time: 02/28/17  4:50 PM  Result Value Ref Range   Alcohol, Ethyl (B) <10 <10 mg/dL    Comment:        LOWEST DETECTABLE LIMIT FOR SERUM ALCOHOL IS 10 mg/dL FOR MEDICAL PURPOSES ONLY Performed at [facility], [address] [place], [place] [address]   cbc     Status: Abnormal   Collection Time: 02/28/17  4:50 PM  Result Value Ref Range   WBC 8.4 3.8 - 10.6 K/uL   RBC 5.10 4.40 - 5.90 MIL/uL   Hemoglobin 14.8 13.0 - 18.0 g/dL   HCT 44.9 40.0 - 52.0 %   MCV 88.1 80.0 - 100.0 fL   MCH 29.0 26.0 - 34.0 pg   MCHC 32.9 32.0 - 36.0 g/dL   RDW 14.6 (H) 11.5 - 14.5 %   Platelets 289 150 - 440 K/uL    Comment: Performed at [facility], [address] [place], [place] [address]  Urine Drug Screen, Qualitative     Status: Abnormal   Collection Time: 02/28/17  4:55 PM  Result Value Ref Range   Tricyclic, Ur Screen NONE DETECTED NONE DETECTED   Amphetamines, Ur Screen NONE DETECTED NONE DETECTED   MDMA (Ecstasy)Ur Screen NONE DETECTED NONE DETECTED   Cocaine Metabolite,Ur [place] POSITIVE (A) NONE DETECTED   Opiate, Ur Screen NONE DETECTED NONE DETECTED   Phencyclidine (PCP) Ur S NONE DETECTED NONE DETECTED   Cannabinoid 50 Ng, Ur [place] POSITIVE (A) NONE DETECTED    Barbiturates, Ur Screen NONE DETECTED NONE DETECTED   Benzodiazepine, Ur Scrn NONE DETECTED NONE DETECTED   Methadone Scn, Ur NONE DETECTED NONE DETECTED    Comment: (NOTE) Tricyclics + metabolites, urine    Cutoff 1000 ng/mL Amphetamines + metabolites, urine  Cutoff 1000 ng/mL MDMA (Ecstasy), urine              Cutoff 500 ng/mL Cocaine Metabolite, urine  Cutoff 300 ng/mL Opiate + metabolites, urine        Cutoff 300 ng/mL Phencyclidine (PCP), urine         Cutoff 25 ng/mL Cannabinoid, urine                 Cutoff 50 ng/mL Barbiturates + metabolites, urine  Cutoff 200 ng/mL Benzodiazepine, urine              Cutoff 200 ng/mL Methadone, urine                   Cutoff 300 ng/mL The urine drug screen provides only a preliminary, unconfirmed analytical test result and should not be used for non-medical purposes. Clinical consideration and professional judgment should be applied to any positive drug screen result due to possible interfering substances. A more specific alternate chemical method must be used in order to obtain a confirmed analytical result. Gas chromatography / mass spectrometry (GC/MS) is the preferred confirmat ory method. Performed at [facility], [address] [place],  [address]     No current facility-administered medications for this encounter.    Current Outpatient Medications  Medication Sig Dispense Refill  . bacitracin-polymyxin b (POLYSPORIN) ophthalmic ointment Place 1 application into the left eye 3 (three) times daily. apply to eye every 12 hours while awake 3.5 g 0  . chlorpheniramine (CHLOR-TRIMETON) 4 MG tablet Take 1 tablet (4 mg total) by mouth 2 (two) times daily as needed for allergies or rhinitis. 30 tablet 0  . HYDROcodone-acetaminophen (NORCO) 5-325 MG tablet Take 1 tablet by mouth every 4 (four) hours as needed for moderate pain. 6 tablet 0  . oseltamivir (TAMIFLU) 75 MG capsule Take 1 capsule (75 mg total) by mouth  daily. 10 capsule 0    Musculoskeletal: Strength & Muscle Tone: within normal limits Gait & Station: normal Patient leans: N/A  Psychiatric Specialty Exam: Physical Exam  Nursing note and vitals reviewed. Constitutional: He appears well-developed and well-nourished.  HENT:  Head: Normocephalic and atraumatic.  Eyes: Conjunctivae are normal. Pupils are equal, round, and reactive to light.  Neck: Normal range of motion.  Cardiovascular: Regular rhythm and normal heart sounds.  Respiratory: Effort normal.  GI: Soft.  Musculoskeletal: Normal range of motion.  Neurological: He is alert.  Skin: Skin is warm and dry.  Psychiatric: His affect is angry and blunt. He is agitated. He is not aggressive and not combative. Thought content is paranoid. Thought content is not delusional. Cognition and memory are impaired. He expresses impulsivity and inappropriate judgment. He exhibits a depressed mood. He expresses suicidal ideation. He is noncommunicative.    Review of Systems  Constitutional: Negative.   HENT: Negative.   Eyes: Negative.   Respiratory: Negative.   Cardiovascular: Negative.   Gastrointestinal: Negative.   Musculoskeletal: Negative.   Skin: Negative.   Neurological: Negative.   Psychiatric/Behavioral: Positive for substance abuse. Negative for depression, hallucinations, memory loss and suicidal ideas. The patient is nervous/anxious and has insomnia.     Blood pressure 136/84, pulse 76, temperature 97.8 F (36.6 C), temperature source Oral, resp. rate 20, weight 68.9 kg (152 lb), SpO2 100 %.Body mass index is 21.81 kg/m.  General Appearance: Casual  Eye Contact:  Fair  Speech:  Clear and Coherent  Volume:  Decreased  Mood:  Irritable  Affect:  Congruent and Constricted  Thought Process:  Goal Directed  Orientation:  Full (Time, Place, and Person)  Thought Content:  Tangential  Suicidal Thoughts:  Yes.  without intent/plan  Homicidal Thoughts:  Unclear  Memory:   Immediate;   Fair Recent;   Fair Remote;   Fair  Judgement:  Impaired  Insight:  Lacking  Psychomotor Activity:  Decreased  Concentration:  Concentration: Fair  Recall:  [employer] of Knowledge:  Fair  Language:  Fair  Akathisia:  No  Handed:  Right  AIMS (if indicated):     Assets:  Physical Health Social Support  ADL's:  Impaired  Cognition:  WNL  Sleep:        Treatment Plan Summary: Medication management and Plan 32 year old man under IVC and has been on a cocaine binge with multiple losses..  Patient presents to me as being irritable and only slightly cooperative.  Presents himself as being hostile.  Does not make any threats however.  He is evasive about conversation around suicidal ideation.  Definitely looks like he is in a very foul mood.  Not saying anything or acting in a manner that would facilitate disposition.  We will plan on proceeding with admission to the hospital.  Case reviewed with TTS and emergency room physician.  Admission orders will be completed.  If no bed is available here we can look towards referral to other facilities if possible as well.  Disposition: Recommend psychiatric Inpatient admission when medically cleared. Supportive therapy provided about ongoing stressors.  Alethia Berthold, MD 02/28/2017 5:47 PM

## 2017-02-28 NOTE — ED Notes (Addendum)
Pt dressed out into appropriate behavioral health clothing with this tech and two [employer] police officers in the rm (one male and one male). Pt belongings consist of navy blue shoes, blue jeans, blue jacket, red/black jacket, brown jacket, a cell phone charger, bottle of synthetic urine, cell phone, black wallet, red string, flash light, two lighters, hand warmers, envelope, $1.54 in change, a silver cup, green shirt, black belt, navy blue socks and gray boxers. Pt belongings placed in two bags. Pt ambulated back to rm 20.

## 2017-02-28 NOTE — ED Notes (Signed)
PT  IVC  FROM  [facility]  SEEN  BY  DR  CLAPACS  PENDING  PLACEMENT  PT  MOVED  TO  [facility] 1 COPIES  OF  IVC  PAPERWORK  GIVEN TO  [facility]  NURSE

## 2017-02-28 NOTE — ED Notes (Signed)

## 2017-02-28 NOTE — ED Notes (Signed)
Hourly rounding reveals patient sleeping in room. No complaints, stable, in no acute distress. Q15 minute rounds and monitoring via [employer] to continue. 

## 2017-02-28 NOTE — ED Triage Notes (Signed)
Pt brought into ed via [employer] with IVC papers states [facility] called with reports of pt being on cocaine binge and sending suicidal texts to family members.

## 2017-02-28 NOTE — ED Notes (Addendum)
MD Clapacs is currently in his room consulting with him - pt can be heard  Rude at times  Blaming others   More tissues provided

## 2017-02-28 NOTE — ED Notes (Signed)
Pt will be changed into hosp app scrubs and personal belongings will be placed in bags.

## 2017-02-28 NOTE — ED Notes (Signed)
Hourly rounding reveals patient sleeping in room. No complaints, stable, in no acute distress. Q15 minute rounds and monitoring via [employer] to continue. Snack and beverage given.

## 2017-02-28 NOTE — BH Assessment (Signed)
Assessment Note  John Wolfe is an 32 y.o. male who presents to the ER via law enforcement, after he was seen at [facility]. Per the report of [facility], the patient was found on the side of a bridge. The mother of his children saw him when she was on her way to work. She called his mother and his mother contacted Event organiser and he was transported to [facility]. [facility] further reports, the patient and his children's mother relationship ended several weeks ago. Since then he has lost his job. On today (02/28/2017), he went to the home of his children and told them "good bye."  As well as gave away personal belongings and have told friends goodbye.  Patient mother petitioned him to be under IVC. Per the IVC, "Acting very different dropping off car to girlfriend walking on bridge texting strange things she had nothing to live for."  During the interview, the patient was guarded and irritable. When asked questions, answers were limited. Majority of the answers were, "none of your business." The information for this assessment was provide by [facility].  Diagnosis: Depression  Past Medical History:  Past Medical History:  Diagnosis Date  . Asthma     History reviewed. No pertinent surgical history.  Family History: No family history on file.  Social History:  reports that he has been smoking.  he has never used smokeless tobacco. He reports that he does not drink alcohol. His drug history is not on file.  Additional Social History:  Alcohol / Drug Use Pain Medications: See PTA Prescriptions: See PTA Over the Counter: See PTA History of alcohol / drug use?: Yes Longest period of sobriety (when/how long): Patient would not answer Negative Consequences of Use: (Patient would not answer) Substance #1 Name of Substance 1: UDS Positive for Cocaine Substance #2 Name of Substance 2: UDS Positive for Cannabis  CIWA: CIWA-Ar BP: 136/84 Pulse Rate: 76 COWS:    Allergies: No Known Allergies  Home Medications:   (Not in a hospital admission)  OB/GYN Status:  No LMP for male patient.  General Assessment Data Location of Assessment: [facility] ED TTS Assessment: In system Is this a Tele or Face-to-Face Assessment?: Face-to-Face Is this an Initial Assessment or a Re-assessment for this encounter?: Initial Assessment Marital status: Separated Maiden name: n/a Is patient pregnant?: No Pregnancy Status: No Living Arrangements: Other (Comment), Parent Can pt return to current living arrangement?: Yes Admission Status: Involuntary Is patient capable of signing voluntary admission?: No(Under IVC) Referral Source: Self/Family/Friend Insurance type: n/a  Medical Screening Exam ([place]) Medical Exam completed: Yes  Crisis Care Plan Living Arrangements: Other (Comment), Parent Legal Guardian: Other:(Self) Name of Psychiatrist: Reports of none Name of Therapist: Reports of none  Education Status Is patient currently in school?: No Current Grade: n/a Highest grade of school patient has completed: n/a Name of school: n/a Contact person: n/a  Risk to self with the past 6 months Suicidal Ideation: Yes-Currently Present Has patient been a risk to self within the past 6 months prior to admission? : Yes Suicidal Intent: Yes-Currently Present Has patient had any suicidal intent within the past 6 months prior to admission? : Yes Is patient at risk for suicide?: Yes Suicidal Plan?: Yes-Currently Present Has patient had any suicidal plan within the past 6 months prior to admission? : Yes Specify Current Suicidal Plan: Jump off a bridge Access to Means: Yes Specify Access to Suicidal Means: Was at a bridge today What has been your use of  drugs/alcohol within the last 12 months?: Cocaine & Cannabis Previous Attempts/Gestures: No How many times?: 0 Other Self Harm Risks: Reports of none Triggers for Past Attempts: None known Intentional Self Injurious Behavior: None Family Suicide History:  Unknown Recent stressful life event(s): Conflict (Comment), Divorce, Job Loss, Financial Problems Persecutory voices/beliefs?: No Depression: Yes Depression Symptoms: Tearfulness, Isolating, Feeling angry/irritable Substance abuse history and/or treatment for substance abuse?: Yes Suicide prevention information given to non-admitted patients: Not applicable  Risk to Others within the past 6 months Homicidal Ideation: No Does patient have any lifetime risk of violence toward others beyond the six months prior to admission? : No Thoughts of Harm to Others: No Current Homicidal Intent: No Current Homicidal Plan: No Access to Homicidal Means: No Identified Victim: Reports of none History of harm to others?: No Assessment of Violence: None Noted Violent Behavior Description: Reports of none Does patient have access to weapons?: No Criminal Charges Pending?: No Does patient have a court date: No Is patient on probation?: No  Psychosis Hallucinations: None noted Delusions: None noted  Mental Status Report Appearance/Hygiene: Unremarkable, In scrubs Eye Contact: Fair Motor Activity: Freedom of movement, Unremarkable Speech: Logical/coherent Level of Consciousness: Alert, Irritable Mood: Suspicious, Irritable, Sad, Depressed Affect: Appropriate to circumstance, Depressed, Irritable Anxiety Level: Minimal Thought Processes: Coherent, Relevant Judgement: Unimpaired Orientation: Person, Place, Time, Situation, Appropriate for developmental age Obsessive Compulsive Thoughts/Behaviors: Minimal  Cognitive Functioning Concentration: Normal Memory: Recent Intact, Remote Intact IQ: Average Insight: Fair Impulse Control: Fair Appetite: Fair Weight Loss: 0 Weight Gain: 0 Sleep: Decreased Total Hours of Sleep: 5 Vegetative Symptoms: None  ADLScreening [facility] Assessment Services) Patient's cognitive ability adequate to safely complete daily activities?: Yes Patient able to express  need for assistance with ADLs?: Yes Independently performs ADLs?: Yes (appropriate for developmental age)  Prior Inpatient Therapy Prior Inpatient Therapy: No Prior Therapy Dates: Reports of none Prior Therapy Facilty/Provider(s): Reports of none Reason for Treatment: Reports of none  Prior Outpatient Therapy Prior Outpatient Therapy: No Prior Therapy Dates: Reports of none Prior Therapy Facilty/Provider(s): Reports of none Reason for Treatment: Reports of none Does patient have an ACCT team?: No Does patient have Intensive In-House Services?  : No Does patient have [facility] services? : No Does patient have P4CC services?: No  ADL Screening (condition at time of admission) Patient's cognitive ability adequate to safely complete daily activities?: Yes Is the patient deaf or have difficulty hearing?: No Does the patient have difficulty seeing, even when wearing glasses/contacts?: No Does the patient have difficulty concentrating, remembering, or making decisions?: No Patient able to express need for assistance with ADLs?: Yes Does the patient have difficulty dressing or bathing?: No Independently performs ADLs?: Yes (appropriate for developmental age) Does the patient have difficulty walking or climbing stairs?: No Weakness of Legs: None Weakness of Arms/Hands: None  Home Assistive Devices/Equipment Home Assistive Devices/Equipment: None  Therapy Consults (therapy consults require a physician order) PT Evaluation Needed: No OT Evalulation Needed: No SLP Evaluation Needed: No Abuse/Neglect Assessment (Assessment to be complete while patient is alone) Abuse/Neglect Assessment Can Be Completed: Yes Physical Abuse: Denies Verbal Abuse: Denies Sexual Abuse: Denies Exploitation of patient/patient's resources: Denies Self-Neglect: Denies Values / Beliefs Cultural Requests During Hospitalization: None Spiritual Requests During Hospitalization: None Consults Spiritual Care  Consult Needed: No Social Work Consult Needed: No Regulatory affairs officer (For Healthcare) Does Patient Have a Medical Advance Directive?: No Would patient like information on creating a medical advance directive?: No - Patient declined    Additional Information  1:1 In Past 12 Months?: No CIRT Risk: No Elopement Risk: No Does patient have medical clearance?: Yes  Child/Adolescent Assessment Running Away Risk: Denies(Patient is an adult)  Disposition:  Disposition Initial Assessment Completed for this Encounter: Yes Disposition of Patient: Inpatient treatment program(Per Dr. Weber Cooks)  On Site Evaluation by:   Reviewed with Physician:    Gunnar Fusi MS, LCAS, LPC, [place], CCSI Therapeutic Triage Specialist 02/28/2017 7:18 PM

## 2017-02-28 NOTE — ED Notes (Signed)
Report to include Situation, Background, Assessment, and Recommendations received from Heather RN. Patient alert and oriented, warm and dry, in no acute distress. Patient denies SI, HI, AVH and pain. Patient made aware of Q15 minute rounds and security cameras for their safety. Patient instructed to come to me with needs or concerns.  

## 2017-02-28 NOTE — ED Provider Notes (Signed)
[facility] Emergency Department Provider Note  ____________________________________________   I have reviewed the triage vital signs and the nursing notes.   HISTORY  Chief Complaint Drug Problem and Suicidal   History limited by and level 5 caveat due to: Patient not being cooperative.   HPI John Wolfe is a 32 y.o. male who presents to the emergency department today under IVC because of concern for abnormal behavior and suicidal ideation. The patient tells me that he does not want to tell me what has been going on because he does not want to say anything that would incriminate him or cause him to stay longer.    Per medical record review patient has a history of asthma  Past Medical History:  Diagnosis Date  . Asthma     Patient Active Problem List   Diagnosis Date Noted  . Substance induced mood disorder ([place]) 02/28/2017  . Suicidal ideation 02/28/2017  . Cocaine abuse ([place]) 02/28/2017    History reviewed. No pertinent surgical history.  Prior to Admission medications   Medication Sig Start Date End Date Taking? Authorizing Provider  bacitracin-polymyxin b (POLYSPORIN) ophthalmic ointment Place 1 application into the left eye 3 (three) times daily. apply to eye every 12 hours while awake 06/23/16   Merlyn Lot, MD  chlorpheniramine (CHLOR-TRIMETON) 4 MG tablet Take 1 tablet (4 mg total) by mouth 2 (two) times daily as needed for allergies or rhinitis. 04/15/15   Beers, Pierce Crane, PA-C  HYDROcodone-acetaminophen (NORCO) 5-325 MG tablet Take 1 tablet by mouth every 4 (four) hours as needed for moderate pain. 06/23/16   Merlyn Lot, MD  oseltamivir (TAMIFLU) 75 MG capsule Take 1 capsule (75 mg total) by mouth daily. 04/15/15   Beers, Pierce Crane, PA-C    Allergies Patient has no known allergies.  No family history on file.  Social History Social History   Tobacco Use  . Smoking status: Current Every Day Smoker  . Smokeless  tobacco: Never Used  Substance Use Topics  . Alcohol use: No  . Drug use: Not on file    Review of Systems Constitutional: No fever/chills Cardiovascular: Denies chest pain. Respiratory: Denies shortness of breath. Gastrointestinal: No abdominal pain.  No nausea, no vomiting.  No diarrhea.  .  ____________________________________________   PHYSICAL EXAM:  VITAL SIGNS: ED Triage Vitals  Enc Vitals Group     BP 02/28/17 1653 136/84     Pulse Rate 02/28/17 1653 76     Resp 02/28/17 1653 20     Temp 02/28/17 1653 97.8 F (36.6 C)     Temp Source 02/28/17 1653 Oral     SpO2 02/28/17 1653 100 %     Weight 02/28/17 1654 152 lb (68.9 kg)   Constitutional: Alert and oriented. Well appearing and in no distress. Eyes: Conjunctivae are normal.  ENT   Head: Normocephalic and atraumatic.   Nose: No congestion/rhinnorhea.   Mouth/Throat: Mucous membranes are moist.   Neck: No stridor. Hematological/Lymphatic/Immunilogical: No cervical lymphadenopathy. Cardiovascular: Normal rate, regular rhythm.  No murmurs, rubs, or gallops.  Respiratory: Normal respiratory effort without tachypnea nor retractions. Breath sounds are clear and equal bilaterally. No wheezes/rales/rhonchi. Gastrointestinal: Soft and non tender. No rebound. No guarding.  Genitourinary: Deferred Musculoskeletal: Normal range of motion in all extremities. No lower extremity edema. Neurologic:  Normal speech and language. No gross focal neurologic deficits are appreciated.  Skin:  Skin is warm, dry and intact. No rash noted. Psychiatric: Tearful  ____________________________________________  LABS (pertinent positives/negatives)  CBC wnl except rdw 14.6 Ethanol <10 UDS cocaine positive, cannabinoid positive CMP wnl except alt, tot proteint  ____________________________________________   EKG  None  ____________________________________________     RADIOLOGY  None  ____________________________________________   PROCEDURES  Procedures  ____________________________________________   INITIAL IMPRESSION / ASSESSMENT AND PLAN / ED COURSE  Pertinent labs & imaging results that were available during my care of the patient were reviewed by me and considered in my medical decision making (see chart for details).  Presented to the emergency department today under IVC because of concerns for anxiety and erratic behavior.  On exam patient is not very forthcoming with me however is tearful.  Patient was seen by psychiatry and they will plan on admission.   ____________________________________________   FINAL CLINICAL IMPRESSION(S) / ED DIAGNOSES  Final diagnoses:  Suicidal ideation     Note: This dictation was prepared with Dragon dictation. Any transcriptional errors that result from this process are unintentional     Nance Pear, MD 02/28/17 1800

## 2017-02-28 NOTE — ED Notes (Signed)
BEHAVIORAL HEALTH ROUNDING Patient sleeping: No. Patient alert and oriented: yes Behavior appropriate: Yes.  ; If no, describe:  Nutrition and fluids offered: yes Toileting and hygiene offered: Yes  Sitter present: q15 minute observations and security monitoring Law enforcement present: Yes  ODS  Plan of care discussed with the pt - he will transfer to the behavioral holding unit to await inpatient bed placement

## 2017-03-01 ENCOUNTER — Inpatient Hospital Stay
Admission: AD | Admit: 2017-03-01 | Discharge: 2017-03-03 | DRG: 885 | Disposition: A | Discharge status: Home / Self Care | Payer: No Typology Code available for payment source | Attending: Psychiatry | Admitting: Psychiatry

## 2017-03-01 ENCOUNTER — Other Ambulatory Visit: Payer: Self-pay

## 2017-03-01 DIAGNOSIS — G4700 Insomnia, unspecified: Secondary | ICD-10-CM | POA: Diagnosis present

## 2017-03-01 DIAGNOSIS — R45851 Suicidal ideations: Secondary | ICD-10-CM

## 2017-03-01 DIAGNOSIS — F1420 Cocaine dependence, uncomplicated: Secondary | ICD-10-CM | POA: Diagnosis present

## 2017-03-01 DIAGNOSIS — Z720 Tobacco use: Secondary | ICD-10-CM

## 2017-03-01 DIAGNOSIS — F1220 Cannabis dependence, uncomplicated: Secondary | ICD-10-CM | POA: Diagnosis present

## 2017-03-01 DIAGNOSIS — F909 Attention-deficit hyperactivity disorder, unspecified type: Secondary | ICD-10-CM | POA: Diagnosis present

## 2017-03-01 DIAGNOSIS — F1994 Other psychoactive substance use, unspecified with psychoactive substance-induced mood disorder: Secondary | ICD-10-CM | POA: Diagnosis present

## 2017-03-01 DIAGNOSIS — F952 Tourette's disorder: Secondary | ICD-10-CM | POA: Diagnosis present

## 2017-03-01 DIAGNOSIS — Z560 Unemployment, unspecified: Secondary | ICD-10-CM

## 2017-03-01 DIAGNOSIS — J45909 Unspecified asthma, uncomplicated: Secondary | ICD-10-CM | POA: Diagnosis present

## 2017-03-01 DIAGNOSIS — F314 Bipolar disorder, current episode depressed, severe, without psychotic features: Secondary | ICD-10-CM | POA: Diagnosis present

## 2017-03-01 DIAGNOSIS — F17200 Nicotine dependence, unspecified, uncomplicated: Secondary | ICD-10-CM | POA: Diagnosis present

## 2017-03-01 MED ORDER — ACETAMINOPHEN 325 MG PO TABS: 650.0000 mg | ORAL_TABLET | Freq: Four times a day (QID) | ORAL | Status: DC | PRN

## 2017-03-01 MED ORDER — ALUM & MAG HYDROXIDE-SIMETH 200-200-20 MG/5ML PO SUSP: 30.0000 mL | ORAL | Status: DC | PRN

## 2017-03-01 MED ORDER — TRAZODONE HCL 100 MG PO TABS
100.0000 mg | ORAL_TABLET | Freq: Every evening | ORAL | Status: DC | PRN
Start: 2017-03-01 — End: 2017-03-03
  Administered 2017-03-01: 100 mg via ORAL
  Filled 2017-03-01: qty 1

## 2017-03-01 MED ORDER — HYDROXYZINE HCL 50 MG PO TABS: 50.0000 mg | ORAL_TABLET | ORAL | Status: DC | PRN

## 2017-03-01 MED ORDER — MAGNESIUM HYDROXIDE 400 MG/5ML PO SUSP: 30.0000 mL | Freq: Every day | ORAL | Status: DC | PRN

## 2017-03-01 NOTE — BH Assessment (Signed)
Patient is to be admitted to [facility] by Dr. Weber Cooks.  Attending Physician will be Dr. Wonda Olds.   Patient has been assigned to room 322-B, by [place] staff is aware of the admission:  Sherry Ruffing, ER Sectary   Dr. Clearnce Hasten, ER MD   Amy B., Patient's Nurse   Criss Rosales, Patient Access. Marland Kitchen

## 2017-03-01 NOTE — ED Notes (Signed)
Pt has been more pleasant and cooperative with staff. Maintained on 15 minute checks and observation by security camera for safety.

## 2017-03-01 NOTE — Progress Notes (Signed)
Received patient from [facility] appeared depressed and calm but denies any suicidal thoughts and ideation his affect is flat , there are no signs of hallucination, delusions or bizarre behaviors, associations are intact, thinking is logical  And thought content is appropriate. Patient is stable and responding well and participating in assessment processes, patient denies abusive to the mother and denies any suicidal attempts or thoughts saying it was only a joke never mean't to kill himself . skin check is complete and body search done with 2 RNs present , no issues with the skin and no contraband found with patient, patient is fed and hydrated with fluids and juices. Patient is in room 322B.

## 2017-03-01 NOTE — ED Notes (Signed)
Pt IVC pending placement to [facility]. 

## 2017-03-01 NOTE — ED Notes (Signed)
Hourly rounding reveals patient sleeping in room. No complaints, stable, in no acute distress. Q15 minute rounds and monitoring via [employer] to continue. 

## 2017-03-01 NOTE — ED Notes (Signed)
Pt has taken a shower. Pt has been polite to staff, less irritable. Maintained on 15 minute checks and observation by security camera for safety.

## 2017-03-01 NOTE — ED Notes (Signed)
Pt to nurses station asking why he is being ignored by the psychiatrist. Security officer spoke with patient about his admission to BMU. Pt told he would have to speak with his psychiatrist downstairs about being discharged. Pt upset, but not acting out at this time. Maintained on 15 minute checks and observation by security camera for safety.

## 2017-03-01 NOTE — ED Notes (Signed)
Report called to RN Alex in BMU. Patient to be transferred to [facility] room 322. Patient is in agreement with plan of care.

## 2017-03-01 NOTE — ED Notes (Signed)
Sandwich and soft drink given.  

## 2017-03-01 NOTE — ED Notes (Signed)
Pt irritable. Answered every question with "I don't want to be here."  Pt wanting to see doctor every hour so he can get out of here. RN explained that was not possible. Pt just repeated, "I don't want to be here." Maintained on 15 minute checks and observation by security camera for safety.

## 2017-03-01 NOTE — ED Notes (Signed)
Pt requesting to speak with Dr. Weber Cooks. RN will make doctor aware. Maintained on 15 minute checks and observation by security camera for safety.

## 2017-03-01 NOTE — ED Notes (Signed)

## 2017-03-01 NOTE — ED Notes (Addendum)
Pt. Awake eating his snack.

## 2017-03-01 NOTE — ED Notes (Signed)
RN spoke with patient about admission to BMU. Pt accepting.

## 2017-03-01 NOTE — ED Provider Notes (Signed)
Vitals:   02/28/17 1653 02/28/17 1922  BP: 136/84 (!) 122/58  Pulse: 76 70  Resp: 20 16  Temp: 97.8 F (36.6 C) 98.2 F (36.8 C)  SpO2: 100% 97%    I assumed care of this morning at shift change.  No reported significant events overnight by report.  I have reviewed vital signs.  I reviewed Dr. Weber Cooks note, psychiatry from yesterday which is recommending hospitalization.  Patient is under IVC.  Awaiting placement either here or other facilities based on availability.  TTS working on disposition.     Lisa Roca, MD 03/01/17 [id number]

## 2017-03-01 NOTE — ED Notes (Signed)
Pt. Alert and oriented, warm and dry, in no distress. Pt. Denies SI, HI, and AVH. Pt. Encouraged to let nursing staff know of any concerns or needs. 

## 2017-03-01 NOTE — Tx Team (Signed)
Initial Treatment Plan 03/01/2017 10:42 PM John Wolfe [id number]    PATIENT STRESSORS: Financial difficulties Loss of job Marital or family conflict Substance abuse   PATIENT STRENGTHS: Active sense of humor Capable of independent living Communication skills Motivation for treatment/growth Supportive family/friends   PATIENT IDENTIFIED PROBLEMS: Suicide ideation    Substance use     Repression / anxiety    hopelessness         DISCHARGE CRITERIA:  Reduction of life-threatening or endangering symptoms to within safe limits Safe-care adequate arrangements made  PRELIMINARY DISCHARGE PLAN: Attend 12-step recovery group Participate in family therapy Placement in alternative living arrangements Return to previous living arrangement  PATIENT/FAMILY INVOLVEMENT: This treatment plan has been presented to and reviewed with the patient, John Wolfe,  The patient  have been given the opportunity to ask questions and make suggestions.  Clemens Catholic, RN 03/01/2017, 10:42 PM

## 2017-03-01 NOTE — ED Notes (Signed)
Patient asleep in room. No noted distress or abnormal behavior. Will continue 15 minute checks and observation by security cameras for safety. 

## 2017-03-02 ENCOUNTER — Encounter: Payer: Self-pay | Admitting: Psychiatry

## 2017-03-02 DIAGNOSIS — F1220 Cannabis dependence, uncomplicated: Secondary | ICD-10-CM | POA: Diagnosis present

## 2017-03-02 DIAGNOSIS — F314 Bipolar disorder, current episode depressed, severe, without psychotic features: Secondary | ICD-10-CM | POA: Diagnosis present

## 2017-03-02 DIAGNOSIS — F17200 Nicotine dependence, unspecified, uncomplicated: Secondary | ICD-10-CM | POA: Diagnosis present

## 2017-03-02 LAB — LIPID PANEL
Cholesterol: 146 mg/dL (ref 0–200)
HDL: 44 mg/dL (ref >40)
LDL CALC: 80 mg/dL (ref 0–99)
TRIGLYCERIDES: 109 mg/dL (ref <150)
Total CHOL/HDL Ratio: 3.3 RATIO
VLDL: 22 mg/dL (ref 0–40)

## 2017-03-02 LAB — HEMOGLOBIN A1C
HEMOGLOBIN A1C: 5.5 % (ref 4.8–5.6)
Mean Plasma Glucose: 111.15 mg/dL

## 2017-03-02 LAB — TSH: TSH: 0.852 u[IU]/mL (ref 0.350–4.500)

## 2017-03-02 MED ORDER — NICOTINE 21 MG/24HR TD PT24
21.0000 mg | MEDICATED_PATCH | Freq: Every day | TRANSDERMAL | Status: DC
  Administered 2017-03-02: 21 mg via TRANSDERMAL
  Filled 2017-03-02 (×2): qty 1

## 2017-03-02 MED ORDER — RISPERIDONE 1 MG PO TABS
2.0000 mg | ORAL_TABLET | Freq: Two times a day (BID) | ORAL | Status: DC
  Administered 2017-03-02: 2 mg via ORAL
  Filled 2017-03-02 (×2): qty 2

## 2017-03-02 MED ORDER — OXCARBAZEPINE 300 MG PO TABS
300.0000 mg | ORAL_TABLET | Freq: Two times a day (BID) | ORAL | Status: DC
  Administered 2017-03-02: 300 mg via ORAL
  Filled 2017-03-02 (×2): qty 1

## 2017-03-02 NOTE — Plan of Care (Signed)
  Progressing Safety: Ability to remain free from injury will improve 03/02/2017 1614 - Progressing by Rise Mu, RN Note Remains safe on the unit. No falls.   Self-Concept: Ability to disclose and discuss suicidal ideas will improve 03/02/2017 1614 - Progressing by Rise Mu, RN Note Denies SI or any thoughts of self harm at this time.  Ability to verbalize positive feelings about self will improve 03/02/2017 1614 - Progressing by Rise Mu, RN Note Affect sad.  Verbalizes that he feels a little better Activity: Interest or engagement in leisure activities will improve 03/02/2017 1614 - Progressing by Rise Mu, RN Note Up to dayroom for meals. Attending groups.  No engagement with peers noted.   Safety: Ability to disclose and discuss suicidal ideas will improve 03/02/2017 1614 - Progressing by Rise Mu, RN

## 2017-03-02 NOTE — H&P (Addendum)
Psychiatric Admission Assessment Adult  Patient Identification: John Wolfe MRN:  [id number] Date of Evaluation:  03/02/2017 Chief Complaint:  Depression Principal Diagnosis: Bipolar I disorder, most recent episode depressed, severe without psychotic features ([place]) Diagnosis:   Patient Active Problem List   Diagnosis Date Noted  . Bipolar I disorder, most recent episode depressed, severe without psychotic features ([place]) [F31.4] 03/02/2017    Priority: High  . Cannabis use disorder, moderate, dependence ([place]) [F12.20] 03/02/2017  . Tobacco use disorder [F17.200] 03/02/2017  . Substance induced mood disorder ([place]) [F19.94] 02/28/2017  . Suicidal ideation [R45.851] 02/28/2017  . Cocaine use disorder, moderate, dependence ([place]) [F14.20] 02/28/2017   History of Present Illness:   Identifying data. John Wolfe is a 32 year old male with a history of mood instability and cocaine addiction.  Chief complaint. "I walked and I texted."  History of present illness. Information was obtained from the patient and the chart. The patient was brought to [facility] by the police after his mother made a report of suicidal threats. He refused to talk to a clinician at [facility] and was brought to the ER. The patient reports that he has been up, unable to sleep, restless and confused since Saturday. He was using cocaine. He had a fight with his separated wife and was walking home. She followed him in the car. He reportedly stopped on over[place] and was texting his family: mother, father and several cousins that he loves them. He denies frank suicidal confessions. His mother called the police. He denies symptoms of depression but has been down for several months now since his wife left him with two young daughters,  "She wanted a better life for out children" and he lost his job. Denies psychotic symptoms but reports repeated periods of hyperactivity, severe insomnia, irritability, poor impulse control that  are frequently accompanied by using substances. It is mostly cocaine. He hardly drinks. No IV drugs.  Past psychiatric history. None reported.  Family psychiatric history. None reported.  Social history. Wife left him in December. He lost his job then and has not been able to pay his bills. No electricity in the house. Will stay with his aunt following discharge.   Total Time spent with patient: 1 hour  Is the patient at risk to self? No.  Has the patient been a risk to self in the past 6 months? No.  Has the patient been a risk to self within the distant past? No.  Is the patient a risk to others? No.  Has the patient been a risk to others in the past 6 months? No.  Has the patient been a risk to others within the distant past? No.   Prior Inpatient Therapy:   Prior Outpatient Therapy:    Alcohol Screening: 1. How often do you have a drink containing alcohol?: 2 to 4 times a month 2. How many drinks containing alcohol do you have on a typical day when you are drinking?: 3 or 4 3. How often do you have six or more drinks on one occasion?: Less than monthly AUDIT-C Score: 4 4. How often during the last year have you found that you were not able to stop drinking once you had started?: Less than monthly 5. How often during the last year have you failed to do what was normally expected from you becasue of drinking?: Less than monthly 6. How often during the last year have you needed a first drink in the morning to get yourself going after  a heavy drinking session?: Less than monthly 7. How often during the last year have you had a feeling of guilt of remorse after drinking?: Monthly 8. How often during the last year have you been unable to remember what happened the night before because you had been drinking?: Less than monthly 9. Have you or someone else been injured as a result of your drinking?: No 10. Has a relative or friend or a doctor or another health worker been concerned about  your drinking or suggested you cut down?: No Alcohol Use Disorder Identification Test Final Score (AUDIT): 10 Intervention/Follow-up: Alcohol Education Substance Abuse History in the last 12 months:  Yes.   Consequences of Substance Abuse: Negative Previous Psychotropic Medications: No  Psychological Evaluations: No  Past Medical History:  Past Medical History:  Diagnosis Date  . Asthma    History reviewed. No pertinent surgical history. Family History: History reviewed. No pertinent family history.  Tobacco Screening: Have you used any form of tobacco in the last 30 days? (Cigarettes, Smokeless Tobacco, Cigars, and/or Pipes): Yes Tobacco use, Select all that apply: 5 or more cigarettes per day Are you interested in Tobacco Cessation Medications?: Yes, will notify MD for an order Counseled patient on smoking cessation including recognizing danger situations, developing coping skills and basic information about quitting provided: Yes Social History:  Social History   Substance and Sexual Activity  Alcohol Use No     Social History   Substance and Sexual Activity  Drug Use Not on file    Additional Social History: Marital status: Single Are you sexually active?: No What is your sexual orientation?: Heterosexual Has your sexual activity been affected by drugs, alcohol, medication, or emotional stress?: No Does patient have children?: Yes How many children?: 2 How is patient's relationship with their children?: Great relationship, 51 and 21 years old.                         Allergies:  No Known Allergies Lab Results:  Results for orders placed or performed during the hospital encounter of 03/01/17 (from the past 48 hour(s))  Hemoglobin A1c     Status: None   Collection Time: 03/02/17  7:15 AM  Result Value Ref Range   Hgb A1c MFr Bld 5.5 4.8 - 5.6 %    Comment: (NOTE) Pre diabetes:          5.7%-6.4% Diabetes:              >6.4% Glycemic control for    <7.0% adults with diabetes    Mean Plasma Glucose 111.15 mg/dL    Comment: Performed at [facility] [address] [place], [place] [address]  Lipid panel     Status: None   Collection Time: 03/02/17  7:15 AM  Result Value Ref Range   Cholesterol 146 0 - 200 mg/dL   Triglycerides 109 <150 mg/dL   HDL 44 >40 mg/dL   Total CHOL/HDL Ratio 3.3 RATIO   VLDL 22 0 - 40 mg/dL   LDL Cholesterol 80 0 - 99 mg/dL    Comment:        Total Cholesterol/HDL:CHD Risk Coronary Heart Disease Risk Table                     Men   Women  1/2 Average Risk   3.4   3.3  Average Risk       5.0   4.4  2  X Average Risk   9.6   7.1  3 X Average Risk  23.4   11.0        Use the calculated Patient Ratio above and the CHD Risk Table to determine the patient's CHD Risk.        ATP III CLASSIFICATION (LDL):  <100     mg/dL   Optimal  100-129  mg/dL   Near or Above                    Optimal  130-159  mg/dL   Borderline  160-189  mg/dL   High  >190     mg/dL   Very High Performed at [facility], [address] [place], [place] [address]   TSH     Status: None   Collection Time: 03/02/17  7:15 AM  Result Value Ref Range   TSH 0.852 0.350 - 4.500 uIU/mL    Comment: Performed by a 3rd Generation assay with a functional sensitivity of <=0.01 uIU/mL. Performed at [facility], [address] [place], [place] [address]     Blood Alcohol level:  Lab Results  Component Value Date   [facility] <10 97/02/6376    Metabolic Disorder Labs:  Lab Results  Component Value Date   HGBA1C 5.5 03/02/2017   MPG 111.15 03/02/2017   No results found for: PROLACTIN Lab Results  Component Value Date   CHOL 146 03/02/2017   TRIG 109 03/02/2017   HDL 44 03/02/2017   CHOLHDL 3.3 03/02/2017   VLDL 22 03/02/2017   LDLCALC 80 03/02/2017    Current Medications: Current Facility-Administered Medications  Medication Dose Route Frequency Provider Last Rate Last Dose  . acetaminophen  (TYLENOL) tablet 650 mg  650 mg Oral Q6H PRN Clapacs, John T, MD      . alum & mag hydroxide-simeth (MAALOX/MYLANTA) 200-200-20 MG/5ML suspension 30 mL  30 mL Oral Q4H PRN Clapacs, John T, MD      . hydrOXYzine (ATARAX/VISTARIL) tablet 50 mg  50 mg Oral Q4H PRN Clapacs, John T, MD      . magnesium hydroxide (MILK OF MAGNESIA) suspension 30 mL  30 mL Oral Daily PRN Clapacs, John T, MD      . nicotine (NICODERM CQ - dosed in mg/24 hours) patch 21 mg  21 mg Transdermal Daily Tomma Ehinger B, MD   21 mg at 03/02/17 1807  . Oxcarbazepine (TRILEPTAL) tablet 300 mg  300 mg Oral BID Sanjana Folz B, MD      . risperiDONE (RISPERDAL) tablet 2 mg  2 mg Oral BID Destynie Toomey B, MD      . traZODone (DESYREL) tablet 100 mg  100 mg Oral QHS PRN Clapacs, Madie Reno, MD   100 mg at 03/01/17 2212   PTA Medications: Medications Prior to Admission  Medication Sig Dispense Refill Last Dose  . bacitracin-polymyxin b (POLYSPORIN) ophthalmic ointment Place 1 application into the left eye 3 (three) times daily. apply to eye every 12 hours while awake (Patient not taking: Reported on 02/28/2017) 3.5 g 0 Completed Course at Unknown time  . chlorpheniramine (CHLOR-TRIMETON) 4 MG tablet Take 1 tablet (4 mg total) by mouth 2 (two) times daily as needed for allergies or rhinitis. (Patient not taking: Reported on 02/28/2017) 30 tablet 0 Completed Course at Unknown time  . HYDROcodone-acetaminophen (NORCO) 5-325 MG tablet Take 1 tablet by mouth every 4 (four) hours as needed for moderate pain. (Patient not taking: Reported on 02/28/2017) 6  tablet 0 Completed Course at Unknown time  . oseltamivir (TAMIFLU) 75 MG capsule Take 1 capsule (75 mg total) by mouth daily. (Patient not taking: Reported on 02/28/2017) 10 capsule 0 Completed Course at Unknown time    Musculoskeletal: Strength & Muscle Tone: within normal limits Gait & Station: normal Patient leans: N/A  Psychiatric Specialty Exam: Physical Exam  Nursing  note and vitals reviewed. Constitutional: He is oriented to person, place, and time. He appears well-developed and well-nourished.  HENT:  Head: Normocephalic and atraumatic.  Eyes: Conjunctivae and EOM are normal. Pupils are equal, round, and reactive to light.  Neck: Normal range of motion. Neck supple.  Cardiovascular: Normal rate, regular rhythm and normal heart sounds.  Respiratory: Effort normal and breath sounds normal.  GI: Soft. Bowel sounds are normal.  Musculoskeletal: Normal range of motion.  Neurological: He is alert and oriented to person, place, and time.  Skin: Skin is warm and dry.  Psychiatric: His speech is normal. His affect is blunt. He is withdrawn. Cognition and memory are normal. He expresses impulsivity.    Review of Systems  Neurological: Negative.   Psychiatric/Behavioral: Positive for substance abuse.  All other systems reviewed and are negative.   Blood pressure 130/84, pulse 77, temperature 98.4 F (36.9 C), temperature source Oral, resp. rate 18, height 5\' 9"  (1.753 m), weight 69.4 kg (153 lb), SpO2 100 %.Body mass index is 22.59 kg/m.  See SRA                                                  Sleep:       Treatment Plan Summary: Daily contact with patient to assess and evaluate symptoms and progress in treatment and Medication management   Mr. Bebe Shaggy is a 32 year old male with a history of mood instability and cocaine abuse admitted for suicidal ideation is the context of cocaine abuse and social stressors.  #Suicidal ideation -patient adamantly denies any thoughts, intention or plans to hurt himself or others -able to contract for safety  #Mood -start Trileptal 300 mg BID -start risperdal 2 mg BID  #Substance abuse -positive for cocaine and cannabis -interested in SA IOP  #Metabolic syndrome monitoring -Lipid panel, TSH and HgbA1C are unremarkable  #Disposition -discharge to home with family -follow up with  [facility]   Observation Level/Precautions:  15 minute checks  Laboratory:  CBC Chemistry Profile UDS UA  Psychotherapy:    Medications:    Consultations:    Discharge Concerns:    Estimated LOS:  Other:     Physician Treatment Plan for Primary Diagnosis: Bipolar I disorder, most recent episode depressed, severe without psychotic features ([place]) Long Term Goal(s): Improvement in symptoms so as ready for discharge  Short Term Goals: Ability to identify changes in lifestyle to reduce recurrence of condition will improve, Ability to verbalize feelings will improve, Ability to disclose and discuss suicidal ideas, Ability to demonstrate self-control will improve, Ability to identify and develop effective coping behaviors will improve, Ability to maintain clinical measurements within normal limits will improve and Ability to identify triggers associated with substance abuse/mental health issues will improve  Physician Treatment Plan for Secondary Diagnosis: Principal Problem:   Bipolar I disorder, most recent episode depressed, severe without psychotic features ([place]) Active Problems:   Substance induced mood disorder ([place])   Suicidal ideation   Cocaine use disorder,  moderate, dependence ([facility])   Cannabis use disorder, moderate, dependence ([facility])   Tobacco use disorder  Long Term Goal(s): Improvement in symptoms so as ready for discharge  Short Term Goals: Ability to identify changes in lifestyle to reduce recurrence of condition will improve, Ability to demonstrate self-control will improve and Ability to identify triggers associated with substance abuse/mental health issues will improve  I certify that inpatient services furnished can reasonably be expected to improve the patient's condition.    Orson Slick, MD 2/21/20197:29 PM

## 2017-03-02 NOTE — [facility] Suicide Risk Assessment (Signed)
[facility] INPATIENT:  Family/Significant Other Suicide Prevention Education  Suicide Prevention Education:  Education Completed; Primary school teacher and Theatre manager, Patients mother and girlfriend [phone]) has been identified by the patient as the family member/significant other with whom the patient will be residing, and identified as the person(s) who will aid the patient in the event of a mental health crisis (suicidal ideations/suicide attempt).  With written consent from the patient, the family member/significant other has been provided the following suicide prevention education, prior to the and/or following the discharge of the patient.  The suicide prevention education provided includes the following:  Suicide risk factors  Suicide prevention and interventions  National Suicide Hotline telephone number  [facility] assessment telephone number  [facility] Emergency Assistance [place] and/or Residential Mobile Crisis Unit telephone number  Request made of family/significant other to:  Remove weapons (e.g., guns, rifles, knives), all items previously/currently identified as safety concern.    Remove drugs/medications (over-the-counter, prescriptions, illicit drugs), all items previously/currently identified as a safety concern.  The family member/significant other verbalizes understanding of the suicide prevention education information provided.  The family member/significant other agrees to remove the items of safety concern listed above.  Mother discussed the reason that she IVC'd the patient. She reports that he had given back the car to his girlfriend and sent them text messages saying that he loves them. Mother reports that he has never done anything like this before. She does report that her brother passed away from suicide in the past. She reports that the patient does not have any access to any guns/weapons.  Darin Engels 03/02/2017, 1:03  PM

## 2017-03-02 NOTE — BHH Suicide Risk Assessment (Addendum)
[facility] Admission Suicide Risk Assessment   Nursing information obtained from:    Demographic factors:    Current Mental Status:    Loss Factors:    Historical Factors:    Risk Reduction Factors:     Total Time spent with patient: 1 hour Principal Problem: Bipolar I disorder, most recent episode depressed, severe without psychotic features ([place]) Diagnosis:   Patient Active Problem List   Diagnosis Date Noted  . Bipolar I disorder, most recent episode depressed, severe without psychotic features ([place]) [F31.4] 03/02/2017    Priority: High  . Cannabis use disorder, moderate, dependence ([place]) [F12.20] 03/02/2017  . Tobacco use disorder [F17.200] 03/02/2017  . Substance induced mood disorder ([place]) [F19.94] 02/28/2017  . Suicidal ideation [R45.851] 02/28/2017  . Cocaine use disorder, moderate, dependence ([place]) [F14.20] 02/28/2017   Subjective Data: suicidal ideation  Continued Clinical Symptoms:  Alcohol Use Disorder Identification Test Final Score (AUDIT): 10 The "Alcohol Use Disorders Identification Test", Guidelines for Use in Primary Care, Second Edition.  World Pharmacologist [facility]). Score between 0-7:  no or low risk or alcohol related problems. Score between 8-15:  moderate risk of alcohol related problems. Score between 16-19:  high risk of alcohol related problems. Score 20 or above:  warrants further diagnostic evaluation for alcohol dependence and treatment.   CLINICAL FACTORS:   Bipolar Disorder:   Mixed State Depression:   Comorbid alcohol abuse/dependence Impulsivity Insomnia Alcohol/Substance Abuse/Dependencies   Musculoskeletal: Strength & Muscle Tone: within normal limits Gait & Station: normal Patient leans: N/A  Psychiatric Specialty Exam: Physical Exam  Nursing note and vitals reviewed. Psychiatric: His speech is normal. Thought content normal. His affect is blunt. He is withdrawn. Cognition and memory are normal. He expresses impulsivity.    Review  of Systems  Neurological: Negative.   Psychiatric/Behavioral: Positive for substance abuse.  All other systems reviewed and are negative.   Blood pressure 130/84, pulse 77, temperature 98.4 F (36.9 C), temperature source Oral, resp. rate 18, height 5\' 9"  (1.753 m), weight 69.4 kg (153 lb), SpO2 100 %.Body mass index is 22.59 kg/m.  General Appearance: Casual  Eye Contact:  Good  Speech:  Clear and Coherent  Volume:  Normal  Mood:  Depressed  Affect:  Flat  Thought Process:  Goal Directed and Descriptions of Associations: Intact  Orientation:  Full (Time, Place, and Person)  Thought Content:  WDL  Suicidal Thoughts:  No  Homicidal Thoughts:  No  Memory:  Immediate;   Fair Recent;   Fair Remote;   Fair  Judgement:  Poor  Insight:  Lacking  Psychomotor Activity:  Normal  Concentration:  Concentration: Fair and Attention Span: Fair  Recall:  [employer] of Knowledge:  Fair  Language:  Fair  Akathisia:  No  Handed:  Right  AIMS (if indicated):     Assets:  Communication Skills Desire for Improvement Housing Physical Health Resilience Social Support  ADL's:  Intact  Cognition:  WNL  Sleep:         COGNITIVE FEATURES THAT CONTRIBUTE TO RISK:  None    SUICIDE RISK:   Minimal: No identifiable suicidal ideation.  Patients presenting with no risk factors but with morbid ruminations; may be classified as minimal risk based on the severity of the depressive symptoms  PLAN OF CARE: hospital admission, medication management, substance abuse counseling, discharge planning.  John Wolfe is a 32 year old male with a history of mood instability and cocaine abuse admitted for suicidal ideation is the context of cocaine  abuse and social stressors.  #Suicidal ideation -patient adamantly denies any thoughts, intention or plans to hurt himself or others -able to contract for safety  #Mood -start Trileptal 300 mg BID -start risperdal 2 mg BID  #Substance abuse -positive for  cocaine and cannabis -interested in SA IOP  #Metabolic syndrome monitoring -Lipid panel, TSH and HgbA1C are unremarkable  #Disposition -discharge to home with family -follow up with [facility]  I certify that inpatient services furnished can reasonably be expected to improve the patient's condition.   Orson Slick, MD 03/02/2017, 7:29 PM

## 2017-03-02 NOTE — BHH Counselor (Signed)
Adult Comprehensive Assessment  Patient ID: John Wolfe, male   DOB: [birth year], 32 y.o.   MRN: [id number]  Information Source: Information source: Patient  Current Stressors:  Employment / Job issues: Unemployed  Family Relationships: Rocky with mother Museum/gallery curator / Lack of resources (include bankruptcy): No other financial resources Housing / Lack of housing: Doesnt have power in his home Substance abuse: Cocaine, THC, Heroin  Living/Environment/Situation:  Living Arrangements: Alone Living conditions (as described by patient or guardian): Doent have power right now How long has patient lived in current situation?: Since November 2018 What is atmosphere in current home: Other (Comment)(No power in home)  Family History:  Marital status: Single Are you sexually active?: No What is your sexual orientation?: Heterosexual Has your sexual activity been affected by drugs, alcohol, medication, or emotional stress?: No Does patient have children?: Yes How many children?: 2 How is patient's relationship with their children?: Great relationship, 37 and 34 years old.  Childhood History:  By whom was/is the patient raised?: Mother, Grandparents, Other (Comment)(Aunt) Description of patient's relationship with caregiver when they were a child: Great relationship Patient's description of current relationship with people who raised him/her: All deceased except mother, relationship is rocky with mom How were you disciplined when you got in trouble as a child/adolescent?: Spankings Does patient have siblings?: Yes Number of Siblings: 1 Description of patient's current relationship with siblings: sister- Fine Did patient suffer any verbal/emotional/physical/sexual abuse as a child?: No Did patient suffer from severe childhood neglect?: No Has patient ever been sexually abused/assaulted/raped as an adolescent or adult?: No Was the patient ever a victim of a crime or a disaster?: No Witnessed  domestic violence?: No Has patient been effected by domestic violence as an adult?: No  Education:  Highest grade of school patient has completed: 12th grade Currently a student?: No Learning disability?: No  Employment/Work Situation:   Employment situation: Unemployed Patient's job has been impacted by current illness: No What is the longest time patient has a held a job?: 6 years Where was the patient employed at that time?: [employer] Has patient ever been in the [employer]?: No Are There Guns or Other Weapons in [name]?: No  Financial Resources:   Museum/gallery curator resources: Support from parents / caregiver Does patient have a Programmer, applications or guardian?: No  Alcohol/Substance Abuse:   What has been your use of drugs/alcohol within the last 12 months?: Cocaine, THC, Heroine If attempted suicide, did drugs/alcohol play a role in this?: No Alcohol/Substance Abuse Treatment Hx: Denies past history Has alcohol/substance abuse ever caused legal problems?: No  Social Support System:   Pensions consultant Support System: None Describe Community Support System: None Type of faith/religion: None- will pray sometimes. How does patient's faith help to cope with current illness?: N/A  Leisure/Recreation:   Leisure and Hobbies: Astronomer  Strengths/Needs:   What things does the patient do well?: Sports- Football and base ball, being a dad. In what areas does patient struggle / problems for patient: Depression  Discharge Plan:   Does patient have access to transportation?: Yes Will patient be returning to same living situation after discharge?: No(To [place]) Plan for living situation after discharge: To [place] Currently receiving community mental health services: No If no, would patient like referral for services when discharged?: Yes (What county?)([place]) Does patient have financial barriers related to discharge medications?:  Yes Patient description of barriers related to discharge medications: Dont have insurance  Summary/Recommendations:  Summary and Recommendations (to be completed by the evaluator): Patient is a 32 year old Caucasian male admitted under and IVC due to having SI. Patient reports sending messages to his family members in which he was IVC'd. Patient was congruent and states that "I don't need to be here." He reports using Cocaine, THC and Heroin. His UDS was positive for Cocaine and THC. Patient reports having a house which has no electricity. At discharge he will go to stay with his aunt in [place] until he gets his tax refund to pay for his electricity to get cut back on. Patient also agreed to follow up with outpatient treatment. While here, patient will benefit from crisis stabilization, medication evaluation, group therapy and psychoeducation, in addition to case management for discharge planning. At discharge, it is recommended that patient remain compliant with the established discharge plan and continue treatment.   Darin Engels. 03/02/2017

## 2017-03-02 NOTE — BHH Group Notes (Signed)
  03/02/2017 9am  Type of Therapy and Topic: Group Therapy: Goals Group: SMART Goals   Participation Level: Active  Description of Group:    The purpose of a daily goals group is to assist and guide patients in setting recovery/wellness-related goals. The objective is to set goals as they relate to the crisis in which they were admitted. Patients will be using SMART goal modalities to set measurable goals. Characteristics of realistic goals will be discussed and patients will be assisted in setting and processing how one will reach their goal. Facilitator will also assist patients in applying interventions and coping skills learned in psycho-education groups to the SMART goal and process how one will achieve defined goal.   Therapeutic Goals:   -Patients will develop and document one goal related to or their crisis in which brought them into treatment.  -Patients will be guided by LCSW using SMART goal setting modality in how to set a measurable, attainable, realistic and time sensitive goal.  -Patients will process barriers in reaching goal.  -Patients will process interventions in how to overcome and successful in reaching goal.   Patient's Goal: Patient was pulled out of group before being able to identify his SMART goal.   Therapeutic Modalities:  Motivational Interviewing  Cognitive Behavioral Therapy  Crisis Intervention Model  SMART goals setting  Darin Engels, LCSW 03/02/2017 9:45 AM

## 2017-03-02 NOTE — BHH Group Notes (Signed)
03/02/2017  Time: 1:00PM  Type of Therapy/Topic:  Group Therapy:  Balance in Life  Participation Level:  Did Not Attend  Description of Group:   This group will address the concept of balance and how it feels and looks when one is unbalanced. Patients will be encouraged to process areas in their lives that are out of balance and identify reasons for remaining unbalanced. Facilitators will guide patients in utilizing problem-solving interventions to address and correct the stressor making their life unbalanced. Understanding and applying boundaries will be explored and addressed for obtaining and maintaining a balanced life. Patients will be encouraged to explore ways to assertively make their unbalanced needs known to significant others in their lives, using other group members and facilitator for support and feedback.  Therapeutic Goals: 1. Patient will identify two or more emotions or situations they have that consume much of in their lives. 2. Patient will identify signs/triggers that life has become out of balance:  3. Patient will identify two ways to set boundaries in order to achieve balance in their lives:  4. Patient will demonstrate ability to communicate their needs through discussion and/or role plays  Summary of Patient Progress: Pt was invited to attend group but chose not to attend. CSW will continue to encourage pt to attend group throughout their admission.   Therapeutic Modalities:   Cognitive Behavioral Therapy Solution-Focused Therapy Assertiveness Training  Alden Hipp, MSW, LCSW 03/02/2017 1:57 PM

## 2017-03-03 ENCOUNTER — Other Ambulatory Visit: Payer: Self-pay

## 2017-03-03 MED ORDER — TRAZODONE HCL 100 MG PO TABS: 100.0000 mg | ORAL_TABLET | Freq: Every evening | ORAL | 1 refills | Status: DC | PRN

## 2017-03-03 MED ORDER — QUETIAPINE FUMARATE 100 MG PO TABS: 100.0000 mg | ORAL_TABLET | Freq: Every day | ORAL | Status: DC

## 2017-03-03 MED ORDER — RISPERIDONE 2 MG PO TABS: 2.0000 mg | ORAL_TABLET | Freq: Two times a day (BID) | ORAL | 1 refills | Status: DC

## 2017-03-03 MED ORDER — QUETIAPINE FUMARATE 100 MG PO TABS: 100.0000 mg | ORAL_TABLET | Freq: Every day | ORAL | 1 refills | Status: DC

## 2017-03-03 MED ORDER — OXCARBAZEPINE 300 MG PO TABS: 300.0000 mg | ORAL_TABLET | Freq: Two times a day (BID) | ORAL | 1 refills | Status: DC

## 2017-03-03 NOTE — Progress Notes (Signed)
  [facility] Adult Case Management Discharge Plan :  Will you be returning to the same living situation after discharge:  Yes,  To family members home At discharge, do you have transportation home?: Yes,  Ex-girlfriend will come and pick him up Do you have the ability to pay for your medications: Yes,  Referred to a provider who can assist.+  Release of information consent forms completed and in the chart;  Patient's signature needed at discharge.  Patient to Follow up at: Follow-up Information    [place] on 03/06/2017.   Why:  Please meet with your peer support specialist Sherrian Divers on [place] March 06, 2017 at 7:15am for peer support services. His number is [phone]. Contact information: [place] [address] [phone]           Next level of care provider has access to [place] and Suicide Prevention discussed: Yes,  Completed with mother and ex-girlfriend.  Have you used any form of tobacco in the last 30 days? (Cigarettes, Smokeless Tobacco, Cigars, and/or Pipes): Yes  Has patient been referred to the Quitline?: Patient refused referral  Patient has been referred for addiction treatment: Yes  Darin Engels, LCSW 03/03/2017, 9:49 AM

## 2017-03-03 NOTE — Discharge Summary (Addendum)
Physician Discharge Summary Note  Patient:  John Wolfe is an 32 y.o., male MRN:  [id number] DOB:  Apr 28, 1985 Patient phone:  [phone] (home)  Patient address:   [address] [address],  Total Time spent with patient: 74  Date of Admission:  03/01/2017 Date of Discharge: 03/03/2017  Reason for Admission:  Suicidal threats.  History of Present Illness:   Identifying data. John Wolfe is a 32 year old male with a history of mood instability and cocaine addiction.  Chief complaint. "I walked and I texted."  History of present illness. Information was obtained from the patient and the chart. The patient was brought to [place] center by the police after his mother made a report of suicidal threats. He refused to talk to a clinician at [facility] and was brought to the ER. The patient reports that he has been up, unable to sleep, restless and confused since Saturday. He was using cocaine. He had a fight with his separated wife and was walking home. She followed him in the car. He reportedly stopped on overpass and was texting his family: mother, father and several cousins that he loves them. He denies frank suicidal confessions. His mother called the police. He denies symptoms of depression but has been down for several months now since his wife left him with two young daughters,  "She wanted a better life for out children" and he lost his job. Denies psychotic symptoms but reports repeated periods of hyperactivity, severe insomnia, irritability, poor impulse control that are frequently accompanied by using substances. It is mostly cocaine. He hardly drinks. No IV drugs.  Past psychiatric history. Per mother, he was diagnosed with ADHD, Tourette's, and bipolar as a child. She remembers Ritalin, Trazodone, Adderall and Risperdal. He could not tyolerate Adderall and had gynecomastia from Risperdal. He was tested at [facility] for Tourette's.  Family psychiatric history. Sister  with mental illness and behavioral problems.   Social history. Wife left him in December. He lost his job then and has not been able to pay his bills. No electricity in the house. Will stay with his aunt following discharge.   Principal Problem: Bipolar I disorder, most recent episode depressed, severe without psychotic features [facility]) Discharge Diagnoses: Patient Active Problem List   Diagnosis Date Noted  . Bipolar I disorder, most recent episode depressed, severe without psychotic features ([place]) [F31.4] 03/02/2017    Priority: High  . Cannabis use disorder, moderate, dependence ([place]) [F12.20] 03/02/2017  . Tobacco use disorder [F17.200] 03/02/2017  . Substance induced mood disorder ([place]) [F19.94] 02/28/2017  . Suicidal ideation [R45.851] 02/28/2017  . Cocaine use disorder, moderate, dependence ([place]) [F14.20] 02/28/2017    Past Medical History:  Past Medical History:  Diagnosis Date  . Asthma    History reviewed. No pertinent surgical history. Family History: History reviewed. No pertinent family history.  Social History:  Social History   Substance and Sexual Activity  Alcohol Use No     Social History   Substance and Sexual Activity  Drug Use Not on file    Social History   Socioeconomic History  . Marital status: Single    Spouse name: None  . Number of children: None  . Years of education: None  . Highest education level: None  Social Needs  . Financial resource strain: None  . Food insecurity - worry: None  . Food insecurity - inability: None  . Transportation needs - medical: None  . Transportation needs - non-medical: None  Occupational  History  . None  Tobacco Use  . Smoking status: Current Every Day Smoker  . Smokeless tobacco: Never Used  Substance and Sexual Activity  . Alcohol use: No  . Drug use: None  . Sexual activity: None  Other Topics Concern  . None  Social History Narrative  . None    Hospital Course:   John Wolfe is a  32 year old male with a history of mood instability and cocaine abuse admitted for suicidal ideation is the context of cocaine abuse and social stressors. Suicidal ideation has resolved. The patient adamantly denies any thoughts, intention or plans to hurt himself or others. He is able to contract for safety. He is forward thinking and more optimistic about the future. He is a loving father.  #Mood, improved -continue Trileptal 300 mg BID -Seroquel 100 mg nightly -history of gynecomastia on Risperdal  -continue Trazodone 100 mg PRN  #Substance abuse -positive for cocaine and cannabis -interested in [facility]  #Metabolic syndrome monitoring -Lipid panel, TSH and HgbA1C are unremarkable -EKG, QTc 422  #Smoking cessation -Nicotine patch was available  #Disposition -discharge to home with family -follow up with [facility]  Physical Findings: AIMS: Facial and Oral Movements Muscles of Facial Expression: None, normal Lips and Perioral Area: None, normal Jaw: None, normal Tongue: None, normal,Extremity Movements Upper (arms, wrists, hands, fingers): None, normal Lower (legs, knees, ankles, toes): None, normal, Trunk Movements Neck, shoulders, hips: None, normal, Overall Severity Severity of abnormal movements (highest score from questions above): None, normal Incapacitation due to abnormal movements: None, normal Patient's awareness of abnormal movements (rate only patient's report): No Awareness, Dental Status Current problems with teeth and/or dentures?: No Does patient usually wear dentures?: No  CIWA:  CIWA-Ar Total: 2 COWS:  COWS Total Score: 0  Musculoskeletal: Strength & Muscle Tone: within normal limits Gait & Station: normal Patient leans: N/A  Psychiatric Specialty Exam: Physical Exam  Nursing note and vitals reviewed. Psychiatric: He has a normal mood and affect. His speech is normal and behavior is normal. Thought content normal. Cognition and memory are normal. He  expresses impulsivity.    Review of Systems  Neurological: Negative.   Psychiatric/Behavioral: Positive for substance abuse.  All other systems reviewed and are negative.   Blood pressure 131/82, pulse 92, temperature 98.8 F (37.1 C), temperature source Oral, resp. rate 18, height 5\' 9"  (1.753 m), weight 69.4 kg (153 lb), SpO2 100 %.Body mass index is 22.59 kg/m.  General Appearance: Casual  Eye Contact:  Good  Speech:  Clear and Coherent  Volume:  Normal  Mood:  Euthymic  Affect:  Appropriate  Thought Process:  Goal Directed and Descriptions of Associations: Intact  Orientation:  Full (Time, Place, and Person)  Thought Content:  WDL  Suicidal Thoughts:  No  Homicidal Thoughts:  No  Memory:  Immediate;   Fair Recent;   Fair Remote;   Fair  Judgement:  Poor  Insight:  Shallow  Psychomotor Activity:  Normal  Concentration:  Concentration: Fair and Attention Span: Fair  Recall:  [employer] of Knowledge:  Fair  Language:  Fair  Akathisia:  No  Handed:  Right  AIMS (if indicated):     Assets:  Communication Skills Desire for Improvement Physical Health Resilience Social Support  ADL's:  Intact  Cognition:  WNL  Sleep:  Number of Hours: 6.25     Have you used any form of tobacco in the last 30 days? (Cigarettes, Smokeless Tobacco, Cigars, and/or Pipes): Yes  Has  this patient used any form of tobacco in the last 30 days? (Cigarettes, Smokeless Tobacco, Cigars, and/or Pipes) Yes, Yes, A prescription for an FDA-approved tobacco cessation medication was offered at discharge and the patient refused  Blood Alcohol level:  Lab Results  Component Value Date   ETH <10 81/01/7508    Metabolic Disorder Labs:  Lab Results  Component Value Date   HGBA1C 5.5 03/02/2017   MPG 111.15 03/02/2017   No results found for: PROLACTIN Lab Results  Component Value Date   CHOL 146 03/02/2017   TRIG 109 03/02/2017   HDL 44 03/02/2017   CHOLHDL 3.3 03/02/2017   VLDL 22 03/02/2017    [place] 80 03/02/2017    See Psychiatric Specialty Exam and Suicide Risk Assessment completed by Attending Physician prior to discharge.  Discharge destination:  Home  Is patient on multiple antipsychotic therapies at discharge:  No   Has Patient had three or more failed trials of antipsychotic monotherapy by history:  No  Recommended Plan for Multiple Antipsychotic Therapies: NA  Discharge Instructions    Diet - low sodium heart healthy   Complete by:  As directed    Increase activity slowly   Complete by:  As directed      Allergies as of 03/03/2017   No Known Allergies     Medication List    STOP taking these medications   bacitracin-polymyxin b ophthalmic ointment Commonly known as:  POLYSPORIN   chlorpheniramine 4 MG tablet Commonly known as:  CHLOR-TRIMETON   HYDROcodone-acetaminophen 5-325 MG tablet Commonly known as:  NORCO   oseltamivir 75 MG capsule Commonly known as:  TAMIFLU     TAKE these medications     Indication  Oxcarbazepine 300 MG tablet Commonly known as:  TRILEPTAL Take 1 tablet (300 mg total) by mouth 2 (two) times daily.  Indication:  bipolar illness   QUEtiapine 100 MG tablet Commonly known as:  SEROQUEL Take 1 tablet (100 mg total) by mouth at bedtime.  Indication:  Depressive Phase of Manic-Depression   traZODone 100 MG tablet Commonly known as:  DESYREL Take 1 tablet (100 mg total) by mouth at bedtime as needed for sleep.  Indication:  [place] on 03/06/2017.   Why:  Please meet with your peer support specialist Sherrian Divers on [place] March 06, 2017 at 7:15am for peer support services. His number is [phone]. Contact information: [place] [address] [phone]           Follow-up recommendations:  Activity:  as tolerated Diet:  regular Other:  keep follow up appointments  Comments:    Signed: Orson Slick,  MD 03/03/2017, 10:30 AM

## 2017-03-03 NOTE — Tx Team (Signed)
Interdisciplinary Treatment and Diagnostic Plan Update  03/03/2017 Time of Session:8:30am John Wolfe MRN: [id number]  Principal Diagnosis: Bipolar I disorder, most recent episode depressed, severe without psychotic features ([place])  Secondary Diagnoses: Principal Problem:   Bipolar I disorder, most recent episode depressed, severe without psychotic features ([place]) Active Problems:   Substance induced mood disorder ([place])   Suicidal ideation   Cocaine use disorder, moderate, dependence ([place])   Cannabis use disorder, moderate, dependence ([place])   Tobacco use disorder   Current Medications:  Current Facility-Administered Medications  Medication Dose Route Frequency Provider Last Rate Last Dose  . acetaminophen (TYLENOL) tablet 650 mg  650 mg Oral Q6H PRN Clapacs, John T, MD      . alum & mag hydroxide-simeth (MAALOX/MYLANTA) 200-200-20 MG/5ML suspension 30 mL  30 mL Oral Q4H PRN Clapacs, John T, MD      . hydrOXYzine (ATARAX/VISTARIL) tablet 50 mg  50 mg Oral Q4H PRN Clapacs, John T, MD      . magnesium hydroxide (MILK OF MAGNESIA) suspension 30 mL  30 mL Oral Daily PRN Clapacs, John T, MD      . nicotine (NICODERM CQ - dosed in mg/24 hours) patch 21 mg  21 mg Transdermal Daily Pucilowska, Jolanta B, MD   21 mg at 03/02/17 1807  . Oxcarbazepine (TRILEPTAL) tablet 300 mg  300 mg Oral BID Pucilowska, Jolanta B, MD   300 mg at 03/02/17 2141  . risperiDONE (RISPERDAL) tablet 2 mg  2 mg Oral BID Pucilowska, Jolanta B, MD   2 mg at 03/02/17 2141  . traZODone (DESYREL) tablet 100 mg  100 mg Oral QHS PRN Clapacs, Madie Reno, MD   100 mg at 03/01/17 2212   PTA Medications: Medications Prior to Admission  Medication Sig Dispense Refill Last Dose  . bacitracin-polymyxin b (POLYSPORIN) ophthalmic ointment Place 1 application into the left eye 3 (three) times daily. apply to eye every 12 hours while awake (Patient not taking: Reported on 02/28/2017) 3.5 g 0 Completed Course at Unknown time  .  chlorpheniramine (CHLOR-TRIMETON) 4 MG tablet Take 1 tablet (4 mg total) by mouth 2 (two) times daily as needed for allergies or rhinitis. (Patient not taking: Reported on 02/28/2017) 30 tablet 0 Completed Course at Unknown time  . HYDROcodone-acetaminophen (NORCO) 5-325 MG tablet Take 1 tablet by mouth every 4 (four) hours as needed for moderate pain. (Patient not taking: Reported on 02/28/2017) 6 tablet 0 Completed Course at Unknown time  . oseltamivir (TAMIFLU) 75 MG capsule Take 1 capsule (75 mg total) by mouth daily. 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   Physician Treatment Plan for Primary Diagnosis: Bipolar I disorder, most recent episode depressed, severe without psychotic features ([place]) Long Term Goal(s): Improvement in symptoms so as ready for discharge Improvement in symptoms so as ready for discharge   Short Term Goals: Ability to identify changes in lifestyle to reduce recurrence of condition will improve Ability to verbalize feelings will improve Ability to disclose and discuss suicidal ideas Ability to demonstrate self-control will improve Ability to identify and develop effective coping behaviors will improve Ability to maintain clinical measurements within normal limits  will improve Ability to identify triggers associated with substance abuse/mental health issues will improve Ability to identify changes in lifestyle to reduce recurrence of condition will improve Ability to demonstrate self-control will improve Ability to  identify triggers associated with substance abuse/mental health issues will improve  Medication Management: Evaluate patient's response, side effects, and tolerance of medication regimen.  Therapeutic Interventions: 1 to 1 sessions, Unit Group sessions and Medication administration.  Evaluation of Outcomes: Progressing  Physician Treatment Plan for Secondary Diagnosis: Principal Problem:   Bipolar I disorder, most recent episode depressed, severe without psychotic features ([place]) Active Problems:   Substance induced mood disorder ([facility])   Suicidal ideation   Cocaine use disorder, moderate, dependence ([facility])   Cannabis use disorder, moderate, dependence ([place])   Tobacco use disorder  Long Term Goal(s): Improvement in symptoms so as ready for discharge Improvement in symptoms so as ready for discharge   Short Term Goals: Ability to identify changes in lifestyle to reduce recurrence of condition will improve Ability to verbalize feelings will improve Ability to disclose and discuss suicidal ideas Ability to demonstrate self-control will improve Ability to identify and develop effective coping behaviors will improve Ability to maintain clinical measurements within normal limits will improve Ability to identify triggers associated with substance abuse/mental health issues will improve Ability to identify changes in lifestyle to reduce recurrence of condition will improve Ability to demonstrate self-control will improve Ability to identify triggers associated with substance abuse/mental health issues will improve     Medication Management: Evaluate patient's response, side effects, and tolerance of medication regimen.  Therapeutic Interventions: 1 to 1 sessions, Unit Group sessions and Medication administration.  Evaluation of Outcomes: Progressing   RN Treatment Plan for Primary Diagnosis: Bipolar I disorder, most recent episode depressed, severe without psychotic features ([place]) Long  Term Goal(s): Knowledge of disease and therapeutic regimen to maintain health will improve  Short Term Goals: Ability to remain free from injury will improve, Ability to demonstrate self-control, Ability to verbalize feelings will improve, Ability to identify and develop effective coping behaviors will improve and Compliance with prescribed medications will improve  Medication Management: RN will administer medications as ordered by provider, will assess and evaluate patient's response and provide education to patient for prescribed medication. RN will report any adverse and/or side effects to prescribing provider.  Therapeutic Interventions: 1 on 1 counseling sessions, Psychoeducation, Medication administration, Evaluate responses to treatment, Monitor vital signs and CBGs as ordered, Perform/monitor CIWA, COWS, AIMS and Fall Risk screenings as ordered, Perform wound care treatments as ordered.  Evaluation of Outcomes: Progressing   LCSW Treatment Plan for Primary Diagnosis: Bipolar I disorder, most recent episode depressed, severe without psychotic features ([place]) Long Term Goal(s): Safe transition to appropriate next level of care at discharge, Engage patient in therapeutic group addressing interpersonal concerns.  Short Term Goals: Engage patient in aftercare planning with referrals and resources, Increase social support, Facilitate patient progression through stages of change regarding substance use diagnoses and concerns, Identify triggers associated with mental health/substance abuse issues and Increase skills for wellness and recovery  Therapeutic Interventions: Assess for all discharge needs, 1 to 1 time with Social worker, Explore available resources and support systems, Assess for adequacy in community support network, Educate family and significant other(s) on suicide prevention, Complete Psychosocial Assessment, Interpersonal group therapy.  Evaluation of Outcomes:  Progressing   Progress in Treatment: Attending groups: Yes. Participating in groups: Yes. Taking medication as prescribed: Yes. Toleration medication: Yes. Family/Significant other contact made: Yes, individual(s)  contacted:  Debria Garret and Stefanie Libel, Patients mother and girlfriend [phone]) Patient understands diagnosis: Yes. Discussing patient identified problems/goals with staff: Yes. Medical problems stabilized or resolved: Yes. Denies suicidal/homicidal ideation: Yes. Issues/concerns per patient self-inventory: Yes. Other:   New problem(s) identified: No, Describe:  None  New Short Term/Long Term Goal(s): N/A  Discharge Plan or Barriers: To return to family members home and follow up with [facility] for peer support and substance abuse services.   Reason for Continuation of Hospitalization: Depression Medication stabilization Withdrawal symptoms  Estimated Length of Stay: Discharge today 03/03/2017  Attendees: Patient: 03/03/2017 9:43 AM  Physician: Dr. Bary Leriche, MD 03/03/2017 9:43 AM  Nursing: Eugenio Hoes, RN 03/03/2017 9:43 AM  RN Care Manager:  03/03/2017 9:43 AM  Social Worker: Darin Engels, [place] 03/03/2017 9:43 AM  Recreational Therapist:  03/03/2017 9:43 AM  Other: Alden Hipp, LCSW 03/03/2017 9:43 AM  Other: Angus Seller 03/03/2017 9:43 AM  Other: Sherrian Divers, [place] Peer Support 03/03/2017 9:43 AM    Scribe for Treatment Team: Darin Engels, LCSW 03/03/2017 9:43 AM

## 2017-03-03 NOTE — Progress Notes (Signed)
D: Patient denies SI/HI/AVH.  Patient affect and mood are anxious.  Patient states, "I don't need to be here.  I'm not like these people.  I know you can see it."  Patient requested information regarding his HS medications while contemplating if he should take them.  Patient did attend evening group. Patient visible on the milieu. No distress noted. A: Support and encouragement offered. Scheduled medications given to pt. Q 15 min checks continued for patient safety. R: Patient receptive. Patient remains safe on the unit.

## 2017-03-03 NOTE — Discharge Summary (Signed)
Patient denies SI, HI and AVH. Discharge information provided to patient. Patient verbalized understanding of discharge instructions. Patient received belongings from locker and verified all items were present. Patient received paper prescriptions for medications and verbalized how to take medication and follow up appointment with [facility]. Nurse escorted patient to waiting room where family members were present. Patient discharge home.

## 2017-03-03 NOTE — [facility] Suicide Risk Assessment (Signed)
[facility] Discharge Suicide Risk Assessment   Principal Problem: Bipolar I disorder, most recent episode depressed, severe without psychotic features [facility]) Discharge Diagnoses:  Patient Active Problem List   Diagnosis Date Noted  . Bipolar I disorder, most recent episode depressed, severe without psychotic features ([place]) [F31.4] 03/02/2017    Priority: High  . Cannabis use disorder, moderate, dependence ([place]) [F12.20] 03/02/2017  . Tobacco use disorder [F17.200] 03/02/2017  . Substance induced mood disorder ([place]) [F19.94] 02/28/2017  . Suicidal ideation [R45.851] 02/28/2017  . Cocaine use disorder, moderate, dependence ([place]) [F14.20] 02/28/2017    Total Time spent with patient: 35  Musculoskeletal: Strength & Muscle Tone: within normal limits Gait & Station: normal Patient leans: N/A  Psychiatric Specialty Exam: Review of Systems  Neurological: Negative.   Psychiatric/Behavioral: Positive for substance abuse.  All other systems reviewed and are negative.   Blood pressure 131/82, pulse 92, temperature 98.8 F (37.1 C), temperature source Oral, resp. rate 18, height 5\' 9"  (1.753 m), weight 69.4 kg (153 lb), SpO2 100 %.Body mass index is 22.59 kg/m.  General Appearance: Casual  Eye Contact::  Good  Speech:  Clear and Coherent409  Volume:  Normal  Mood:  Irritable  Affect:  Appropriate  Thought Process:  Goal Directed and Descriptions of Associations: Intact  Orientation:  Full (Time, Place, and Person)  Thought Content:  WDL  Suicidal Thoughts:  No  Homicidal Thoughts:  No  Memory:  Immediate;   Fair Recent;   Fair Remote;   Fair  Judgement:  Poor  Insight:  Shallow  Psychomotor Activity:  Normal  Concentration:  Fair  Recall:  [employer] of Knowledge:Fair  Language: Fair  Akathisia:  No  Handed:  Right  AIMS (if indicated):     Assets:  Communication Skills Desire for Improvement Housing Physical Health Resilience Social Support  Sleep:  Number of Hours: 6.25   Cognition: WNL  ADL's:  Intact   Mental Status Per Nursing Assessment::   On Admission:     Demographic Factors:  Male, Divorced or widowed, Low socioeconomic status, Living alone and Unemployed  Loss Factors: Decrease in vocational status, Loss of significant relationship and Financial problems/change in socioeconomic status  Historical Factors: Impulsivity  Risk Reduction Factors:   Responsible for children under 64 years of age, Sense of responsibility to family and Positive social support  Continued Clinical Symptoms:  Bipolar Disorder:   Depressive phase Alcohol/Substance Abuse/Dependencies  Cognitive Features That Contribute To Risk:  None    Suicide Risk:  Minimal: No identifiable suicidal ideation.  Patients presenting with no risk factors but with morbid ruminations; may be classified as minimal risk based on the severity of the depressive symptoms    Plan Of Care/Follow-up recommendations:  Activity:  as tolerated Diet:  regular Other:  keep follow up appointments  Orson Slick, MD 03/03/2017, 7:52 AM

## 2017-03-08 ENCOUNTER — Ambulatory Visit: Payer: Self-pay

## 2017-03-09 ENCOUNTER — Telehealth: Payer: Self-pay | Admitting: Pharmacy Technician

## 2017-03-09 NOTE — Telephone Encounter (Signed)
Patient eligible to receive medication assistance at [facility] through 2019, as long as eligibility requirements continue to be met.  [facility]

## 2017-03-22 ENCOUNTER — Ambulatory Visit: Payer: Self-pay

## 2017-10-16 ENCOUNTER — Other Ambulatory Visit: Payer: Self-pay

## 2017-10-16 ENCOUNTER — Encounter: Payer: Self-pay | Admitting: Emergency Medicine

## 2017-10-16 ENCOUNTER — Emergency Department: Payer: Self-pay

## 2017-10-16 ENCOUNTER — Emergency Department
Admission: EM | Admit: 2017-10-16 | Discharge: 2017-10-16 | Disposition: A | Discharge status: Home / Self Care | Payer: Self-pay | Attending: Emergency Medicine | Admitting: Emergency Medicine

## 2017-10-16 DIAGNOSIS — Z79899 Other long term (current) drug therapy: Secondary | ICD-10-CM | POA: Insufficient documentation

## 2017-10-16 DIAGNOSIS — F17200 Nicotine dependence, unspecified, uncomplicated: Secondary | ICD-10-CM | POA: Insufficient documentation

## 2017-10-16 DIAGNOSIS — D367 Benign neoplasm of other specified sites: Secondary | ICD-10-CM

## 2017-10-16 DIAGNOSIS — Z113 Encounter for screening for infections with a predominantly sexual mode of transmission: Secondary | ICD-10-CM | POA: Insufficient documentation

## 2017-10-16 DIAGNOSIS — J069 Acute upper respiratory infection, unspecified: Secondary | ICD-10-CM | POA: Insufficient documentation

## 2017-10-16 LAB — URINALYSIS, COMPLETE (UACMP) WITH MICROSCOPIC
Bacteria, UA: NONE SEEN
Bilirubin Urine: NEGATIVE
GLUCOSE, UA: NEGATIVE mg/dL
Hgb urine dipstick: NEGATIVE
KETONES UR: NEGATIVE mg/dL
LEUKOCYTES UA: NEGATIVE
Nitrite: NEGATIVE
PROTEIN: NEGATIVE mg/dL
Specific Gravity, Urine: 1.017 (ref 1.005–1.030)
pH: 7 (ref 5.0–8.0)

## 2017-10-16 LAB — CHLAMYDIA/NGC RT PCR (ARMC ONLY)
Chlamydia Tr: NOT DETECTED
N GONORRHOEAE: NOT DETECTED

## 2017-10-16 MED ORDER — BENZONATATE 100 MG PO CAPS: 100.0000 mg | ORAL_CAPSULE | Freq: Three times a day (TID) | ORAL | 0 refills | Status: AC | PRN

## 2017-10-16 MED ORDER — CEFTRIAXONE SODIUM 250 MG IJ SOLR
250.0000 mg | Freq: Once | INTRAMUSCULAR | Status: AC
  Administered 2017-10-16: 250 mg via INTRAMUSCULAR
  Filled 2017-10-16: qty 250

## 2017-10-16 MED ORDER — LIDOCAINE HCL (PF) 1 % IJ SOLN
INTRAMUSCULAR | Status: AC
  Filled 2017-10-16: qty 5

## 2017-10-16 MED ORDER — AZITHROMYCIN 500 MG PO TABS
1000.0000 mg | ORAL_TABLET | Freq: Once | ORAL | Status: AC
  Administered 2017-10-16: 1000 mg via ORAL
  Filled 2017-10-16: qty 2

## 2017-10-16 MED ORDER — METRONIDAZOLE 500 MG PO TABS
2000.0000 mg | ORAL_TABLET | Freq: Once | ORAL | Status: AC
  Administered 2017-10-16: 2000 mg via ORAL
  Filled 2017-10-16: qty 4

## 2017-10-16 MED ORDER — DOXYCYCLINE HYCLATE 50 MG PO CAPS: 100.0000 mg | ORAL_CAPSULE | Freq: Two times a day (BID) | ORAL | 0 refills | Status: AC

## 2017-10-16 NOTE — ED Provider Notes (Signed)
[facility] Emergency Department Provider Note  ____________________________________________  Time seen: Approximately 11:38 AM  I have reviewed the triage vital signs and the nursing notes.   HISTORY  Chief Complaint Mass    HPI John Wolfe is a 32 y.o. male that presents to the emergency department for evaluation of multiple medical complaints. He has had a left elbow mass for 1 month. He has drained it personally twice with white/yellow drainage. It is not painful or red. He has also had dysuria and penile discharge for 1 week. He had unprotected intercourse 1 month ago. He has also had nasal congestion, cough for 1-1.5 weeks. No fever.    Past Medical History:  Diagnosis Date  . Asthma     Patient Active Problem List   Diagnosis Date Noted  . Bipolar I disorder, most recent episode depressed, severe without psychotic features ([place]) 03/02/2017  . Cannabis use disorder, moderate, dependence ([place]) 03/02/2017  . Tobacco use disorder 03/02/2017  . Substance induced mood disorder ([place]) 02/28/2017  . Suicidal ideation 02/28/2017  . Cocaine use disorder, moderate, dependence ([place]) 02/28/2017    History reviewed. No pertinent surgical history.  Prior to Admission medications   Medication Sig Start Date End Date Taking? Authorizing Provider  benzonatate (TESSALON PERLES) 100 MG capsule Take 1 capsule (100 mg total) by mouth 3 (three) times daily as needed for cough. 10/16/17 10/16/18  Laban Emperor, PA-C  doxycycline (VIBRAMYCIN) 50 MG capsule Take 2 capsules (100 mg total) by mouth 2 (two) times daily for 10 days. 10/16/17 10/26/17  Laban Emperor, PA-C  Oxcarbazepine (TRILEPTAL) 300 MG tablet Take 1 tablet (300 mg total) by mouth 2 (two) times daily. 03/03/17   Pucilowska, Herma Ard B, MD  QUEtiapine (SEROQUEL) 100 MG tablet Take 1 tablet (100 mg total) by mouth at bedtime. 03/03/17   Pucilowska, Herma Ard B, MD  traZODone (DESYREL) 100 MG tablet Take 1  tablet (100 mg total) by mouth at bedtime as needed for sleep. 03/03/17   Pucilowska, Wardell Honour, MD    Allergies Patient has no known allergies.  No family history on file.  Social History Social History   Tobacco Use  . Smoking status: Current Every Day Smoker  . Smokeless tobacco: Never Used  Substance Use Topics  . Alcohol use: No  . Drug use: Not on file     Review of Systems  Constitutional: No fever/chills ENT: Positive for nasal congestion. Cardiovascular: No chest pain. Respiratory: Positive for cough. No SOB. Gastrointestinal: No abdominal pain.  No nausea, no vomiting.  Genitourinary: Positive for dysuria. Musculoskeletal: Negative for musculoskeletal pain. Skin: Negative for rash, abrasions, lacerations, ecchymosis. Neurological: Negative for headaches   ____________________________________________   PHYSICAL EXAM:  VITAL SIGNS: ED Triage Vitals [10/16/17 1112]  Enc Vitals Group     BP 126/69     Pulse Rate 66     Resp 18     Temp 97.7 F (36.5 C)     Temp Source Oral     SpO2 98 %     Weight 165 lb (74.8 kg)     Height 5\' 10"  (1.778 m)     Head Circumference      Peak Flow      Pain Score 0     Pain Loc      Pain Edu?      Excl. in [place]?      Constitutional: Alert and oriented. Well appearing and in no acute distress. Eyes: Conjunctivae are normal. PERRL.  EOMI. Head: Atraumatic. ENT:      Ears:      Nose: No congestion/rhinnorhea.      Mouth/Throat: Mucous membranes are moist.  Neck: No stridor.   Cardiovascular: Normal rate, regular rhythm.  Good peripheral circulation. Respiratory: Normal respiratory effort without tachypnea or retractions. Lungs CTAB. Good air entry to the bases with no decreased or absent breath sounds. Gastrointestinal: Bowel sounds 4 quadrants. Soft and nontender to palpation. No guarding or rigidity. No palpable masses. No distention.  Musculoskeletal: Full range of motion to all extremities. No gross deformities  appreciated.  No pain with range of motion of left elbow. Neurologic:  Normal speech and language. No gross focal neurologic deficits are appreciated.  Skin:  Skin is warm, dry and intact.  1 cm x 1 cm nontender mass to left proximal forearm.  No erythema. Psychiatric: Mood and affect are normal. Speech and behavior are normal. Patient exhibits appropriate insight and judgement.   ____________________________________________   LABS (all labs ordered are listed, but only abnormal results are displayed)  Labs Reviewed  URINALYSIS, COMPLETE (UACMP) WITH MICROSCOPIC - Abnormal; Notable for the following components:      Result Value   Color, Urine YELLOW (*)    APPearance CLEAR (*)    All other components within normal limits  CHLAMYDIA/NGC RT PCR ([facility] ONLY)   ____________________________________________  EKG   ____________________________________________  RADIOLOGY Robinette Haines, personally viewed and evaluated these images (plain radiographs) as part of my medical decision making, as well as reviewing the written report by the radiologist.   Dg Chest 2 View  Result Date: 10/16/2017 CLINICAL DATA:  Cough. EXAM: CHEST - 2 VIEW COMPARISON:  Chest x-ray dated October 18, 2009. FINDINGS: The heart size and mediastinal contours are within normal limits. Both lungs are clear. The visualized skeletal structures are unremarkable. IMPRESSION: No active cardiopulmonary disease. Electronically Signed   By: Titus Dubin M.D.   On: 10/16/2017 12:04    ____________________________________________    PROCEDURES  Procedure(s) performed:    Procedures    Medications  lidocaine (PF) (XYLOCAINE) 1 % injection (has no administration in time range)  cefTRIAXone (ROCEPHIN) injection 250 mg (250 mg Intramuscular Given 10/16/17 1322)  metroNIDAZOLE (FLAGYL) tablet 2,000 mg (2,000 mg Oral Given 10/16/17 1320)  azithromycin (ZITHROMAX) tablet 1,000 mg (1,000 mg Oral Given 10/16/17  1321)     ____________________________________________   INITIAL IMPRESSION / ASSESSMENT AND PLAN / ED COURSE  Pertinent labs & imaging results that were available during my care of the patient were reviewed by me and considered in my medical decision making (see chart for details).  Review of the [place] CSRS was performed in accordance of the [place] prior to dispensing any controlled drugs.   Patient presents emergency department for evaluation of multiple medical complaints.  Symptoms are consistent with URI, cyst, STD.  Vital signs and exam are reassuring.  Chest x-ray negative for acute cardiopulmonary processes.  No indication of bacterial skin infection.  Urinalysis remarkable for white blood cells.  Patient was treated empirically for STDs.  He was given IM ceftriaxone, oral azithromycin, oral Flagyl in the ED.  Patient will be discharged home with prescriptions for doxycycline, Tessalon Perles. Patient is to follow up with health department and primary care as directed. Patient is given ED precautions to return to the ED for any worsening or new symptoms.     ____________________________________________  FINAL CLINICAL IMPRESSION(S) / ED DIAGNOSES  Final diagnoses:  Upper respiratory tract infection, unspecified  type  Dermoid cyst of arm, left  Screening for STD (sexually transmitted disease)      NEW MEDICATIONS STARTED DURING THIS VISIT:  ED Discharge Orders         Ordered    doxycycline (VIBRAMYCIN) 50 MG capsule  2 times daily     10/16/17 1332    benzonatate (TESSALON PERLES) 100 MG capsule  3 times daily PRN     10/16/17 1332              This chart was dictated using voice recognition software/Dragon. Despite best efforts to proofread, errors can occur which can change the meaning. Any change was purely unintentional.    Laban Emperor, PA-C 10/16/17 1538    Lavonia Drafts, MD 10/16/17 1539

## 2017-10-16 NOTE — ED Notes (Signed)
See triage note presents with a raised area to left forearm  States area has been there for a while   Has been able to get some discharge from area in past    Also has he is having some penile discharge

## 2017-10-16 NOTE — Discharge Instructions (Addendum)
Your gonorrhea and Chlamydia tests are still pending.  You may call the emergency department later for results.

## 2017-10-16 NOTE — ED Triage Notes (Signed)
Notes lump back of L forearm x 2 weeks, no injury.

## 2020-09-29 ENCOUNTER — Emergency Department: Payer: Self-pay

## 2020-09-29 ENCOUNTER — Other Ambulatory Visit: Payer: Self-pay

## 2020-09-29 ENCOUNTER — Inpatient Hospital Stay
Admission: EM | Admit: 2020-09-29 | Discharge: 2020-10-02 | DRG: 157 | Disposition: A | Discharge status: Home / Self Care | Payer: Self-pay | Attending: Internal Medicine | Admitting: Internal Medicine

## 2020-09-29 DIAGNOSIS — Z79899 Other long term (current) drug therapy: Secondary | ICD-10-CM

## 2020-09-29 DIAGNOSIS — F17210 Nicotine dependence, cigarettes, uncomplicated: Secondary | ICD-10-CM | POA: Diagnosis present

## 2020-09-29 DIAGNOSIS — L0291 Cutaneous abscess, unspecified: Secondary | ICD-10-CM

## 2020-09-29 DIAGNOSIS — F319 Bipolar disorder, unspecified: Secondary | ICD-10-CM | POA: Diagnosis present

## 2020-09-29 DIAGNOSIS — R7989 Other specified abnormal findings of blood chemistry: Secondary | ICD-10-CM

## 2020-09-29 DIAGNOSIS — K122 Cellulitis and abscess of mouth: Principal | ICD-10-CM | POA: Diagnosis present

## 2020-09-29 DIAGNOSIS — U071 COVID-19: Secondary | ICD-10-CM | POA: Diagnosis present

## 2020-09-29 DIAGNOSIS — K029 Dental caries, unspecified: Secondary | ICD-10-CM | POA: Diagnosis present

## 2020-09-29 DIAGNOSIS — Z79891 Long term (current) use of opiate analgesic: Secondary | ICD-10-CM

## 2020-09-29 LAB — BASIC METABOLIC PANEL
Anion gap: 7 (ref 5–15)
BUN: 8 mg/dL (ref 6–20)
CO2: 30 mmol/L (ref 22–32)
Calcium: 9 mg/dL (ref 8.9–10.3)
Chloride: 100 mmol/L (ref 98–111)
Creatinine, Ser: 0.92 mg/dL (ref 0.61–1.24)
GFR, Estimated: >60 mL/min (ref >60)
Glucose, Bld: 100 mg/dL — ABNORMAL HIGH (ref 70–99)
Potassium: 3.9 mmol/L (ref 3.5–5.1)
Sodium: 137 mmol/L (ref 135–145)

## 2020-09-29 LAB — CBC
HCT: 44.7 % (ref 39.0–52.0)
Hemoglobin: 15.5 g/dL (ref 13.0–17.0)
MCH: 29.7 pg (ref 26.0–34.0)
MCHC: 34.7 g/dL (ref 30.0–36.0)
MCV: 85.6 fL (ref 80.0–100.0)
Platelets: 239 10*3/uL (ref 150–400)
RBC: 5.22 MIL/uL (ref 4.22–5.81)
RDW: 13.7 % (ref 11.5–15.5)
WBC: 9.6 10*3/uL (ref 4.0–10.5)
nRBC: 0 % (ref 0.0–0.2)

## 2020-09-29 LAB — RESP PANEL BY RT-PCR (FLU A&B, COVID) ARPGX2
Influenza A by PCR: NEGATIVE
Influenza B by PCR: NEGATIVE
SARS Coronavirus 2 by RT PCR: POSITIVE — AB

## 2020-09-29 LAB — LACTIC ACID, PLASMA
Lactic Acid, Venous: 2.7 mmol/L (ref 0.5–1.9)
Lactic Acid, Venous: 1.9 mmol/L (ref 0.5–1.9)

## 2020-09-29 MED ORDER — SODIUM CHLORIDE 0.9 % IV BOLUS
1000.0000 mL | Freq: Once | INTRAVENOUS | Status: AC
  Administered 2020-09-29: 1000 mL via INTRAVENOUS

## 2020-09-29 MED ORDER — SODIUM CHLORIDE 0.9 % IV SOLN: INTRAVENOUS | Status: DC

## 2020-09-29 MED ORDER — CHLORHEXIDINE GLUCONATE 0.12 % MT SOLN
15.0000 mL | Freq: Four times a day (QID) | OROMUCOSAL | Status: DC
  Administered 2020-09-30 – 2020-10-02 (×8): 15 mL via OROMUCOSAL
  Filled 2020-09-29 (×7): qty 15

## 2020-09-29 MED ORDER — DEXAMETHASONE SODIUM PHOSPHATE 10 MG/ML IJ SOLN
10.0000 mg | Freq: Once | INTRAMUSCULAR | Status: AC
  Administered 2020-09-29: 10 mg via INTRAVENOUS
  Filled 2020-09-29: qty 1

## 2020-09-29 MED ORDER — DEXAMETHASONE SODIUM PHOSPHATE 10 MG/ML IJ SOLN
10.0000 mg | Freq: Three times a day (TID) | INTRAMUSCULAR | Status: DC
  Administered 2020-09-30 – 2020-10-02 (×7): 10 mg via INTRAVENOUS
  Filled 2020-09-29 (×7): qty 1

## 2020-09-29 MED ORDER — CHLORHEXIDINE GLUCONATE 0.12 % MT SOLN
OROMUCOSAL | Status: AC
  Administered 2020-09-29: 15 mL via OROMUCOSAL
  Filled 2020-09-29: qty 15

## 2020-09-29 MED ORDER — ACETAMINOPHEN 650 MG RE SUPP
650.0000 mg | Freq: Four times a day (QID) | RECTAL | Status: DC | PRN
  Filled 2020-09-29: qty 1

## 2020-09-29 MED ORDER — MORPHINE SULFATE (PF) 4 MG/ML IV SOLN
4.0000 mg | Freq: Once | INTRAVENOUS | Status: AC
  Administered 2020-09-29: 4 mg via INTRAVENOUS
  Filled 2020-09-29: qty 1

## 2020-09-29 MED ORDER — MORPHINE SULFATE (PF) 2 MG/ML IV SOLN
2.0000 mg | INTRAVENOUS | Status: DC | PRN
  Administered 2020-09-30 – 2020-10-02 (×8): 2 mg via INTRAVENOUS
  Filled 2020-09-29 (×8): qty 1

## 2020-09-29 MED ORDER — SODIUM CHLORIDE 0.9 % IV SOLN
3.0000 g | Freq: Once | INTRAVENOUS | Status: AC
  Administered 2020-09-29: 3 g via INTRAVENOUS
  Filled 2020-09-29: qty 8

## 2020-09-29 MED ORDER — ONDANSETRON HCL 4 MG PO TABS: 4.0000 mg | ORAL_TABLET | Freq: Four times a day (QID) | ORAL | Status: DC | PRN

## 2020-09-29 MED ORDER — IOHEXOL 350 MG/ML SOLN
75.0000 mL | Freq: Once | INTRAVENOUS | Status: AC | PRN
  Administered 2020-09-29: 75 mL via INTRAVENOUS

## 2020-09-29 MED ORDER — ACETAMINOPHEN 325 MG PO TABS: 650.0000 mg | ORAL_TABLET | Freq: Four times a day (QID) | ORAL | Status: DC | PRN

## 2020-09-29 MED ORDER — ONDANSETRON HCL 4 MG/2ML IJ SOLN: 4.0000 mg | Freq: Four times a day (QID) | INTRAMUSCULAR | Status: DC | PRN

## 2020-09-29 NOTE — ED Notes (Signed)
Pt reports right lower tooth ache for 5 days with swelling right side of face and right side of neck.  Pt spitting phlegm up.  No resp distress.  Iv in place and meds given .  Pt alert

## 2020-09-29 NOTE — H&P (Signed)
History and Physical    John Wolfe XTA:[id number] DOB: 05-12-1985 DOA: 09/29/2020  PCP: Patient, No Pcp Per (Inactive)   Patient coming from: home  I have personally briefly reviewed patient's old medical records in [place]  Chief Complaint: right jaw pain and toothache  HPI: John Wolfe is a 35 y.o. male with medical  bipolar disorder who presents to the ED with 5-day history of right-sided dental pain and swelling in the jaw, that has been getting increasingly more severe and now associated with low-grade fevers.  States he is spitting up a lot of saliva which has a bad taste in it.  Does not have difficulty swallowing or breathing does not have a hoarse voice.  ED course: On arrival temperature 99.1, BP 135/93, pulse 83 with respirations 18 with O2 sat 100% on room air WBC 9600 with lactic acid 2.7.  CBC and BMP otherwise unremarkable  Imaging: CT soft tissue neck: 2.1 x 1.6 x 1.8 cm gas and fluid containing abscess along the inner margin of the right mandible. of odontogenic origin with extensive inflammatory changes causing displacement of right submandibular gland among other findings  ED provider spoke with ENT, Dr. Ladene Artist who advised on Unasyn and Decadron and admit to hospitalist service but will be available if patient's condition deteriorated but at the present time no need for intubation as patient is protecting his airway.  Hospitalist consulted for admission.  Review of Systems: As per HPI otherwise all other systems on review of systems negative.    Past Medical History:  Diagnosis Date   Asthma     History reviewed. No pertinent surgical history.   reports that he has been smoking. He has never used smokeless tobacco. He reports that he does not drink alcohol. No history on file for drug use.  No Known Allergies  History reviewed. No pertinent family history.    Prior to Admission medications   Medication Sig Start Date End Date  Taking? Authorizing Provider  Oxcarbazepine (TRILEPTAL) 300 MG tablet Take 1 tablet (300 mg total) by mouth 2 (two) times daily. 03/03/17   Pucilowska, Herma Ard B, MD  QUEtiapine (SEROQUEL) 100 MG tablet Take 1 tablet (100 mg total) by mouth at bedtime. 03/03/17   Pucilowska, Herma Ard B, MD  traZODone (DESYREL) 100 MG tablet Take 1 tablet (100 mg total) by mouth at bedtime as needed for sleep. 03/03/17   Clovis Fredrickson, MD    Physical Exam: Vitals:   09/29/20 1755 09/29/20 2015 09/29/20 2030  BP: (!) 135/93 (!) 152/80 (!) 141/85  Pulse: 83  (!) 101  Resp: 18    Temp: 99.1 F (37.3 C)    SpO2: 100%  100%     Vitals:   09/29/20 1755 09/29/20 2015 09/29/20 2030  BP: (!) 135/93 (!) 152/80 (!) 141/85  Pulse: 83  (!) 101  Resp: 18    Temp: 99.1 F (37.3 C)    SpO2: 100%  100%    Constitutional: Alert and oriented x 3 . Sitting upright, spitting into bag but no drooling, no stridor, does not appear distressed. Speaking fluently and comfortably HEENT:      Head: Normocephalic and atraumatic.         Eyes: PERLA, EOMI, Conjunctivae are normal. Sclera is non-icteric.       Mouth/Throat: Swelling noted on the right submandibular swelling and tenderness. Dental caries right lower molar      Neck: Supple with no signs of meningismus. Cardiovascular: Regular rate  and rhythm. No murmurs, gallops, or rubs. 2+ symmetrical distal pulses are present . No JVD. No LE edema Respiratory: Respiratory effort normal .Lungs sounds clear bilaterally. No wheezes, crackles, or rhonchi.  Gastrointestinal: Soft, non tender, and non distended with positive bowel sounds.  Genitourinary: No CVA tenderness. Musculoskeletal: Nontender with normal range of motion in all extremities. No cyanosis, or erythema of extremities. Neurologic:  Face is symmetric. Moving all extremities. No gross focal neurologic deficits . Skin: Skin is warm, dry.  No rash or ulcers Psychiatric: Mood and affect are normal    Labs on  Admission: I have personally reviewed following labs and imaging studies  CBC: Recent Labs  Lab 09/29/20 1759  WBC 9.6  HGB 15.5  HCT 44.7  MCV 85.6  PLT 709   Basic Metabolic Panel: Recent Labs  Lab 09/29/20 1759  NA 137  K 3.9  CL 100  CO2 30  GLUCOSE 100*  BUN 8  CREATININE 0.92  CALCIUM 9.0   GFR: CrCl cannot be calculated (Unknown ideal weight.). Liver Function Tests: No results for input(s): AST, ALT, ALKPHOS, BILITOT, PROT, ALBUMIN in the last 168 hours. No results for input(s): LIPASE, AMYLASE in the last 168 hours. No results for input(s): AMMONIA in the last 168 hours. Coagulation Profile: No results for input(s): INR, PROTIME in the last 168 hours. Cardiac Enzymes: No results for input(s): CKTOTAL, CKMB, CKMBINDEX, TROPONINI in the last 168 hours. BNP (last 3 results) No results for input(s): PROBNP in the last 8760 hours. HbA1C: No results for input(s): HGBA1C in the last 72 hours. CBG: No results for input(s): GLUCAP in the last 168 hours. Lipid Profile: No results for input(s): CHOL, HDL, LDLCALC, TRIG, CHOLHDL, LDLDIRECT in the last 72 hours. Thyroid Function Tests: No results for input(s): TSH, T4TOTAL, FREET4, T3FREE, THYROIDAB in the last 72 hours. Anemia Panel: No results for input(s): VITAMINB12, FOLATE, FERRITIN, TIBC, IRON, RETICCTPCT in the last 72 hours. Urine analysis:    Component Value Date/Time   COLORURINE YELLOW (A) 10/16/2017 1130   APPEARANCEUR CLEAR (A) 10/16/2017 1130   LABSPEC 1.017 10/16/2017 1130   PHURINE 7.0 10/16/2017 1130   GLUCOSEU NEGATIVE 10/16/2017 1130   HGBUR NEGATIVE 10/16/2017 1130   BILIRUBINUR NEGATIVE 10/16/2017 1130   KETONESUR NEGATIVE 10/16/2017 1130   PROTEINUR NEGATIVE 10/16/2017 1130   UROBILINOGEN 0.2 10/18/2009 1627   NITRITE NEGATIVE 10/16/2017 1130   LEUKOCYTESUR NEGATIVE 10/16/2017 1130    Radiological Exams on Admission: CT Soft Tissue Neck W Contrast  Result Date: 09/29/2020 CLINICAL  DATA:  Right submandibular swelling. Additional history provided: Patient reports right dental pain, submandibular soft tissue swelling, pain developed 1 week ago, but increasing over the last 5 days, low-grade fever. EXAM: CT NECK WITH CONTRAST TECHNIQUE: Multidetector CT imaging of the neck was performed using the standard protocol following the bolus administration of intravenous contrast. CONTRAST:  17mL OMNIPAQUE IOHEXOL 350 MG/ML SOLN COMPARISON:  No pertinent prior exams available for comparison. FINDINGS: Pharynx and larynx: Poor dentition with multiple carious teeth and multifocal periapical lucency. Notably, there is periapical lucency surrounding the right mandibular second premolar and right mandibular second molar. Subtle periapical lucency is also questioned along the roots of the right mandibular third molar (series 5, image 22). There are prominent inflammatory changes and edema within the right greater than left floor of mouth and submandibular spaces. This includes a 2.1 x 1.6 x 1.8 cm gas and fluid containing abscess along the inner margin of the right mandible, in close proximity  to the right mandibular first premolar and right mandibular molars (for instance as seen on series 6, image 31) (series 2, image 42). Mucosal/submucosal edema also appears to extend to the right oropharynx with mild effacement of the aerodigestive tract. The hypopharynx and larynx are unremarkable. Salivary glands: The right submandibular gland is posteriorly displaced by extensive edema and inflammatory changes within the right submandibular space. However, the right submandibular gland appears intrinsically unremarkable. The bilateral parotid and left submandibular glands are unremarkable. Thyroid: Subcentimeter right thyroid lobe nodule, not meeting consensus criteria for ultrasound follow-up based on size. Lymph nodes: Cervical lymphadenopathy, predominantly on the right, and likely reactive. Vascular: The major  vascular structures of the neck are patent. Limited intracranial: No evidence of acute intracranial mallet within the field of view. Visualized orbits: Incompletely imaged. No mass or acute finding at the imaged levels. Mastoids and visualized paranasal sinuses: No significant paranasal sinus disease or mastoid effusion at the imaged levels. Skeleton: Reversal of the expected cervical lordosis. Upper chest: No consolidation within the imaged lung apices. 1.4 x 1.1 x 1.9 cm air-filled focus within the right paratracheal region, which may reflect a tracheal or esophageal diverticulum (for instance as seen on series 6, image 57) (series 2, image 85). These results were called by telephone at the time of interpretation on 09/29/2020 at 7:26 pm to provider Dr. Nickolas Madrid, who verbally acknowledged these results. IMPRESSION: Prominent inflammatory changes and edema within the right greater than left floor of mouth, and submandibular spaces. This includes a 2.1 x 1.6 x 1.8 cm gas and fluid-containing abscess along the inner margin of the right mandible, in close proximity to the right mandibular first premolar and right mandibular molars. The abscess and floor of mouth/submandibular space infection is presumed odontogenic in origin given poor dentition with multifocal periapical lucencies. Notably, periapical lucency surrounds the right mandibular first premolar, right mandibular second molar and possibly the right mandibular third molar. Mucosal/submucosal edema also extends to the right oropharynx, with mild effacement of the airway at this level. Extensive inflammatory changes within the right submandibular space posteriorly displaces the right submandibular gland. Cervical lymphadenopathy, predominantly on the right, likely reactive. 1.9 cm air-filled focus within the right paratracheal region, which may reflect an incidental tracheal or esophageal diverticulum. Electronically Signed   By: Kellie Simmering D.O.   On: 09/29/2020  19:29     Assessment/Plan 35 year old male with history of bipolar disorder who presents to the ED with 5-day history of increasingly severe right-sided dental pain and swelling in the jaw, now associated with low-grade fevers.      Abscess of right submandibular region   Dental caries -CT soft tissue neck: 2.1 x 1.6 x 1.8 cm gas and fluid containing abscess along the inner margin of the right mandible. of odontogenic origin with extensive inflammatory changes causing displacement of right submandibular gland among other findings - Discussed with ENT, Dr. Ladene Artist  via secure chat with following recommendations: - Continue Unasyn and Decadron - Pain control - Chlorhexidine oral rinse 4 times daily -Full liquid diet - Keep head of bed elevated - Unlikely to go to the OR in the a.m. -ENT will see in the a.m.    Elevated lactic acid level - Likely related to local tissue hypoxia.  Not meeting sepsis criteria  DVT prophylaxis: SCDs Code Status: full code  Family Communication:  none  Disposition Plan: Back to previous home environment Consults called: ENT Status:At the time of admission, it appears that the appropriate admission status  for this patient is INPATIENT. This is judged to be reasonable and necessary in order to provide the required intensity of service to ensure the patient's safety given the presenting symptoms, physical exam findings, and initial radiographic and laboratory data in the context of their  Comorbid conditions.   Patient requires inpatient status due to high intensity of service, high risk for further deterioration and high frequency of surveillance required.   I certify that at the point of admission it is my clinical judgment that the patient will require inpatient hospital care spanning beyond [facility]     09/29/2020, 9:25 PM

## 2020-09-29 NOTE — ED Provider Notes (Signed)
Emergency Medicine Provider Triage Evaluation Note  John Wolfe , a 35 y.o. male  was evaluated in triage.  Pt complains of right dental pain, submandibular soft tissue swelling.  Developed pain 1 week ago but over the last 5 days symptoms have been increasing.  Low-grade fever present.  Review of Systems  Positive: Fever, dental pain, facial swelling, painful swallowing Negative: Trismus, nausea, vomiting  Physical Exam  BP (!) 135/93   Pulse 83   Temp 99.1 F (37.3 C)   Resp 18   SpO2 100%  Gen:   Awake, alert and oriented with no distress. Resp:  Normal effort  MSK:   Moves extremities without difficulty  Other:  No pharyngeal erythema or exudates.  Right lower mandibular dental decay.  Submandibular swelling, tenderness and induration present.  Patient presents in a seated position leaning forward spitting into a bag.  Speaks in normal sentences  Medical Decision Making  Medically screening exam initiated at 6:11 PM.  Appropriate orders placed.  Kamarion Zagami was informed that the remainder of the evaluation will be completed by another provider, this initial triage assessment does not replace that evaluation, and the importance of remaining in the ED until their evaluation is complete.     Duanne Guess, PA-C 09/29/20 1814    Vanessa , MD 09/29/20 2222

## 2020-09-29 NOTE — ED Provider Notes (Signed)
[facility] Emergency Department Provider Note  ____________________________________________   Event Date/Time   First MD Initiated Contact with Patient 09/29/20 2010     (approximate)  I have reviewed the triage vital signs and the nursing notes.   HISTORY  Chief Complaint Abscess    HPI John Wolfe is a 35 y.o. male who is otherwise healthy who comes in with R neck swelling.  Patient states that he has had about 1 week of some right dental pain and a little bit of soft tissue swelling and submandibularly.  However its been gradually increasing over the past 5 days and getting more worse and having more pain, constant, nothing makes it better or worse.  He states that he will spit up his saliva due to some pain with swallowing as well as the taste of it but he can still swallow if he wants to.  He has had some low-grade associated fevers.          Past Medical History:  Diagnosis Date   Asthma     Patient Active Problem List   Diagnosis Date Noted   Bipolar I disorder, most recent episode depressed, severe without psychotic features ([place]) 03/02/2017   Cannabis use disorder, moderate, dependence ([place]) 03/02/2017   Tobacco use disorder 03/02/2017   Substance induced mood disorder ([place]) 02/28/2017   Suicidal ideation 02/28/2017   Cocaine use disorder, moderate, dependence ([place]) 02/28/2017    History reviewed. No pertinent surgical history.  Prior to Admission medications   Medication Sig Start Date End Date Taking? Authorizing Provider  Oxcarbazepine (TRILEPTAL) 300 MG tablet Take 1 tablet (300 mg total) by mouth 2 (two) times daily. 03/03/17   Pucilowska, Herma Ard B, MD  QUEtiapine (SEROQUEL) 100 MG tablet Take 1 tablet (100 mg total) by mouth at bedtime. 03/03/17   Pucilowska, Herma Ard B, MD  traZODone (DESYREL) 100 MG tablet Take 1 tablet (100 mg total) by mouth at bedtime as needed for sleep. 03/03/17   Pucilowska, Wardell Honour, MD     Allergies Patient has no known allergies.  No family history on file.  Social History Social History   Tobacco Use   Smoking status: Every Day   Smokeless tobacco: Never  Substance Use Topics   Alcohol use: No      Review of Systems Constitutional: No fever/chills Eyes: No visual changes. ENT: Swelling of the right side of neck Cardiovascular: Denies chest pain. Respiratory: Denies shortness of breath. Gastrointestinal: No abdominal pain.  No nausea, no vomiting.  No diarrhea.  No constipation. Genitourinary: Negative for dysuria. Musculoskeletal: Negative for back pain. Skin: Negative for rash. Neurological: Negative for headaches, focal weakness or numbness. All other ROS negative ____________________________________________   PHYSICAL EXAM:  VITAL SIGNS: ED Triage Vitals [09/29/20 1755]  Enc Vitals Group     BP (!) 135/93     Pulse Rate 83     Resp 18     Temp 99.1 F (37.3 C)     Temp src      SpO2 100 %     Weight      Height      Head Circumference      Peak Flow      Pain Score 10     Pain Loc      Pain Edu?      Excl. in [place]?     Constitutional: Alert and oriented. Well appearing and in no acute distress. Eyes: Conjunctivae are normal. EOMI. Head: Atraumatic. Nose:  No congestion/rhinnorhea. Mouth/Throat: Swelling noted on the right submandibular area  Able to open his mouth.  It is soft underneath his tongue. He is spitting into a bag. Neck: No stridor. Trachea Midline. FROM Cardiovascular: Normal rate, regular rhythm. Grossly normal heart sounds.  Good peripheral circulation. Respiratory: Normal respiratory effort.  No retractions. Lungs CTAB. Gastrointestinal: Soft and nontender. No distention. No abdominal bruits.  Musculoskeletal: No lower extremity tenderness nor edema.  No joint effusions. Neurologic:  Normal speech and language. No gross focal neurologic deficits are appreciated.  Skin:  Skin is warm, dry and intact. No rash  noted. Psychiatric: Mood and affect are normal. Speech and behavior are normal. GU: Deferred   ____________________________________________   LABS (all labs ordered are listed, but only abnormal results are displayed)  Labs Reviewed  BASIC METABOLIC PANEL - Abnormal; Notable for the following components:      Result Value   Glucose, Bld 100 (*)    All other components within normal limits  RESP PANEL BY RT-PCR (FLU A&B, COVID) ARPGX2  CBC  LACTIC ACID, PLASMA  LACTIC ACID, PLASMA   ____________________________________________  RADIOLOGY   Official radiology report(s): CT Soft Tissue Neck W Contrast  Result Date: 09/29/2020 CLINICAL DATA:  Right submandibular swelling. Additional history provided: Patient reports right dental pain, submandibular soft tissue swelling, pain developed 1 week ago, but increasing over the last 5 days, low-grade fever. EXAM: CT NECK WITH CONTRAST TECHNIQUE: Multidetector CT imaging of the neck was performed using the standard protocol following the bolus administration of intravenous contrast. CONTRAST:  90mL OMNIPAQUE IOHEXOL 350 MG/ML SOLN COMPARISON:  No pertinent prior exams available for comparison. FINDINGS: Pharynx and larynx: Poor dentition with multiple carious teeth and multifocal periapical lucency. Notably, there is periapical lucency surrounding the right mandibular second premolar and right mandibular second molar. Subtle periapical lucency is also questioned along the roots of the right mandibular third molar (series 5, image 22). There are prominent inflammatory changes and edema within the right greater than left floor of mouth and submandibular spaces. This includes a 2.1 x 1.6 x 1.8 cm gas and fluid containing abscess along the inner margin of the right mandible, in close proximity to the right mandibular first premolar and right mandibular molars (for instance as seen on series 6, image 31) (series 2, image 42). Mucosal/submucosal edema also  appears to extend to the right oropharynx with mild effacement of the aerodigestive tract. The hypopharynx and larynx are unremarkable. Salivary glands: The right submandibular gland is posteriorly displaced by extensive edema and inflammatory changes within the right submandibular space. However, the right submandibular gland appears intrinsically unremarkable. The bilateral parotid and left submandibular glands are unremarkable. Thyroid: Subcentimeter right thyroid lobe nodule, not meeting consensus criteria for ultrasound follow-up based on size. Lymph nodes: Cervical lymphadenopathy, predominantly on the right, and likely reactive. Vascular: The major vascular structures of the neck are patent. Limited intracranial: No evidence of acute intracranial mallet within the field of view. Visualized orbits: Incompletely imaged. No mass or acute finding at the imaged levels. Mastoids and visualized paranasal sinuses: No significant paranasal sinus disease or mastoid effusion at the imaged levels. Skeleton: Reversal of the expected cervical lordosis. Upper chest: No consolidation within the imaged lung apices. 1.4 x 1.1 x 1.9 cm air-filled focus within the right paratracheal region, which may reflect a tracheal or esophageal diverticulum (for instance as seen on series 6, image 57) (series 2, image 85). These results were called by telephone at the time of  interpretation on 09/29/2020 at 7:26 pm to provider Dr. Nickolas Madrid, who verbally acknowledged these results. IMPRESSION: Prominent inflammatory changes and edema within the right greater than left floor of mouth, and submandibular spaces. This includes a 2.1 x 1.6 x 1.8 cm gas and fluid-containing abscess along the inner margin of the right mandible, in close proximity to the right mandibular first premolar and right mandibular molars. The abscess and floor of mouth/submandibular space infection is presumed odontogenic in origin given poor dentition with multifocal periapical  lucencies. Notably, periapical lucency surrounds the right mandibular first premolar, right mandibular second molar and possibly the right mandibular third molar. Mucosal/submucosal edema also extends to the right oropharynx, with mild effacement of the airway at this level. Extensive inflammatory changes within the right submandibular space posteriorly displaces the right submandibular gland. Cervical lymphadenopathy, predominantly on the right, likely reactive. 1.9 cm air-filled focus within the right paratracheal region, which may reflect an incidental tracheal or esophageal diverticulum. Electronically Signed   By: Kellie Simmering D.O.   On: 09/29/2020 19:29    ____________________________________________   PROCEDURES  Procedure(s) performed (including Critical Care):  Procedures   ____________________________________________   INITIAL IMPRESSION / ASSESSMENT AND PLAN / ED COURSE  Mamoru Takeshita was evaluated in Emergency Department on 09/29/2020 for the symptoms described in the history of present illness. He was evaluated in the context of the global COVID-19 pandemic, which necessitated consideration that the patient might be at risk for infection with the SARS-CoV-2 virus that causes COVID-19. Institutional protocols and algorithms that pertain to the evaluation of patients at risk for COVID-19 are in a state of rapid change based on information released by regulatory bodies including the CDC and federal and state organizations. These policies and algorithms were followed during the patient's care in the ED.    Patient comes in with right-sided facial swelling.  CT scan ordered from triage to evaluate for abscess, cellulitis, Ludwigs angina.    CT consistent with large abscess on the right mandible with some edema in the floor of the mouth as well.  This is concerning for Ludwick's however underneath his tongue is soft at this time this could be an early liquids we will start her on  Unasyn, give a dose of Decadron to help with the swelling.  Will let ENT know and discussed with the hospital team for admission.  This time he is protecting his airway and does not need to be intubated will need to be carefully monitored.  8:24 PM At this time patient does not meet sepsis criteria so blood cultures and lactate were not ordered      ____________________________________________   FINAL CLINICAL IMPRESSION(S) / ED DIAGNOSES   Final diagnoses:  Abscess      MEDICATIONS GIVEN DURING THIS VISIT:  Medications  Ampicillin-Sulbactam (UNASYN) 3 g in sodium chloride 0.9 % 100 mL IVPB (has no administration in time range)  dexamethasone (DECADRON) injection 10 mg (has no administration in time range)  morphine 4 MG/ML injection 4 mg (has no administration in time range)  iohexol (OMNIPAQUE) 350 MG/ML injection 75 mL (75 mLs Intravenous Contrast Given 09/29/20 1907)     ED Discharge Orders     None        Note:  This document was prepared using Dragon voice recognition software and may include unintentional dictation errors.    Vanessa [place], MD 09/29/20 2024

## 2020-09-29 NOTE — ED Triage Notes (Signed)
Pt comes with c/o abscess. Pt states he noticed this last week. Pt states pain and swelling.

## 2020-09-29 NOTE — ED Notes (Signed)
Pt eating ice cream

## 2020-09-30 ENCOUNTER — Encounter: Payer: Self-pay | Admitting: Internal Medicine

## 2020-09-30 DIAGNOSIS — R7989 Other specified abnormal findings of blood chemistry: Secondary | ICD-10-CM

## 2020-09-30 DIAGNOSIS — K122 Cellulitis and abscess of mouth: Principal | ICD-10-CM

## 2020-09-30 LAB — HIV ANTIBODY (ROUTINE TESTING W REFLEX): HIV Screen 4th Generation wRfx: NONREACTIVE

## 2020-09-30 MED ORDER — NICOTINE 21 MG/24HR TD PT24
21.0000 mg | MEDICATED_PATCH | Freq: Every day | TRANSDERMAL | Status: DC
  Administered 2020-09-30 – 2020-10-02 (×3): 21 mg via TRANSDERMAL
  Filled 2020-09-30 (×4): qty 1

## 2020-09-30 MED ORDER — SODIUM CHLORIDE 0.9 % IV SOLN
3.0000 g | Freq: Four times a day (QID) | INTRAVENOUS | Status: DC
  Administered 2020-09-30 – 2020-10-02 (×10): 3 g via INTRAVENOUS
  Filled 2020-09-30: qty 3
  Filled 2020-09-30: qty 8
  Filled 2020-09-30: qty 3
  Filled 2020-09-30: qty 8
  Filled 2020-09-30: qty 3
  Filled 2020-09-30 (×2): qty 8
  Filled 2020-09-30: qty 3
  Filled 2020-09-30 (×2): qty 8
  Filled 2020-09-30: qty 3
  Filled 2020-09-30: qty 8

## 2020-09-30 NOTE — ED Notes (Signed)
Pt called out asking for pain medication. No other concerns voiced. See MAR for details.

## 2020-09-30 NOTE — ED Notes (Signed)
Pt resting with eyes closed. VSS. NAD Call light within reach. Will continue to monitor for changes.

## 2020-09-30 NOTE — ED Notes (Signed)
Secure msg sent to Garret Reddish, Therapist, sports for [employer]

## 2020-09-30 NOTE — ED Notes (Signed)
Pt resting comfortably in bed, NAD. No needs identified at this time. Bed low & locked. Call light & personal items within reach.

## 2020-09-30 NOTE — ED Notes (Signed)
Gave pt ginger ale and mango ice.

## 2020-09-30 NOTE — ED Notes (Signed)
Pt awake in room, he is [place]. Gave him [employer], [place] Ice and applesauce until his lunch tray arrives. NAD. Call light within reach. Will continue to monitor.

## 2020-09-30 NOTE — Consult Note (Signed)
Warren, Lindahl [id number] Sep 05, 1985 Flora Lipps, MD  Reason for Consult: Right submandibular swelling  HPI: The patient is a 35 year old white male who has been healthy but had had a bad tooth in the right mandible.  He had a week of dental pain with a little bit of swelling in the submandibular space.  Is gradually worsening over 5 days such that he presented to the emergency room yesterday with more severe pain and hurting more when he swallows.  He could get his saliva down fine his voice was clear.  He was not short of breath at all.  At her little bit to swallow this was easier for him just to spit his saliva out.  He had foul tasting saliva as well for the last day or 2 and has been spitting out some pus.  He has not had severe fever or other whole body symptoms, it seems all to be related to his right mandible area.  He knows he has had some bad teeth for a while that he has not taken care of.  He has had a little bit of a low-grade fever but no high fevers.  Allergies: No Known Allergies  ROS: Review of systems normal other than 12 systems except per HPI.  PMH:  Past Medical History:  Diagnosis Date   Asthma     FH: History reviewed. No pertinent family history.  SH:  Social History   Socioeconomic History   Marital status: Single    Spouse name: Not on file   Number of children: Not on file   Years of education: Not on file   Highest education level: Not on file  Occupational History   Not on file  Tobacco Use   Smoking status: Every Day   Smokeless tobacco: Never  Substance and Sexual Activity   Alcohol use: No   Drug use: Not on file   Sexual activity: Not on file  Other Topics Concern   Not on file  Social History Narrative   Not on file   Social Determinants of Health   Financial Resource Strain: Not on file  Food Insecurity: Not on file  Transportation Needs: Not on file  Physical Activity: Not on file  Stress: Not on file  Social Connections: Not  on file  Intimate Partner Violence: Not on file    PSH: History reviewed. No pertinent surgical history.  Physical  Exam: Patient is awake and alert very cooperative and chatty.  His voice is totally clear.  He is breathing easily and has no shortness of breath at all.  He has now feeling like his swallow is totally normal after doses of Unasyn and Decadron.  His swelling is gone down some.  His right submandibular triangle is firm and swollen on the neck.  There is no other nodes palpable in his neck.  His oropharynx shows no swelling the anterior floor of mouth.  He is a little tender along the inside of his mandible on the right side but there is no evidence of edema here.  His gums are normal on both sides.  He is got an obvious defect in his second molar from a large cavity.  His tongue mobility is normal and there is no swelling of his tongue at all.  Palpation of the anterior floor mouth reveals some firmness and slight swelling along the lingual side of the mandible that is fairly tender.  There is no swelling of the tongue muscles at all.  The posterior  pharynx is clear without redness.  His nose is open and clear on both sides.  I reviewed his CT scan in detail.  This shows soft tissue swelling of his submandibular triangle gland.  There is no evidence of a stone.  This is pushing on the tongue muscle slightly but there is an intact fascial plane between the 2 and no obvious swelling of the tongue muscle normal.  The larynx looks totally normal with no swelling at the epiglottis or supraglottis.  His vocal cords are wide open and there is no evidence for swelling around the laryngeal inlet at all.   A/P: Patient has had a tooth root abscess secondary to a dental carry that is now led to infection in the submandibular space.  This is not an abscess that requires any drainage at this point.  He has what looks like a small opening on his CT scan where he has been getting some drainage out that is  protected him from having a large abscess.  He needs to continue to rinse his mouth with the chlorhexidine on a regular basis to help clear this out of there.  He has had dramatic improvement with the Unasyn and Decadron so far.  I think he will need at least 48 hours of IV antibiotics and then can be switched to oral medications and remain on those for another 10 days.  After 48 hours of Decadron 10 mg twice daily then he can switch over to a prednisone taper over 12 days.  This should resolve the inflammation or infection.  He knows that he must get that tooth dealt with, and probably removed to resolve this more long-term.  If he does not solve the tooth problem his neck infection will recur.  He does not need ENT follow-up but needs to see a dentist to get his tooth taken care of. He can increase his diet now to soft foods and continue to rinse his mouth out with the chlorhexidine 4 times a day for at least another week or so Call if further questions or problems   Huey Romans 09/30/2020 8:10 AM

## 2020-09-30 NOTE — Progress Notes (Signed)
Pharmacy Antibiotic Note  John Wolfe is a 35 y.o. male admitted on 09/29/2020 with mandibular abscess.  Pharmacy has been consulted for Unasyn dosing.  Plan: Unasyn 3 gm q6h per indication and renal fxn.  Pharmacy will continue to monitor and will adjust abx dosing if warranted.  Height: 5\' 10"  (177.8 cm) Weight: 74.8 kg (165 lb) IBW/kg (Calculated) : 73  Temp (24hrs), Avg:99.1 F (37.3 C), Min:99.1 F (37.3 C), Max:99.1 F (37.3 C)  Recent Labs  Lab 09/29/20 1759 09/29/20 2035 09/29/20 2236  WBC 9.6  --   --   CREATININE 0.92  --   --   LATICACIDVEN  --  2.7* 1.9    Estimated Creatinine Clearance: 115.7 mL/min (by C-G formula based on SCr of 0.92 mg/dL).    No Known Allergies  Antimicrobials this admission: 9/20 Unasyn >>   Microbiology results: No labs ordered/pending at this time.  Thank you for allowing pharmacy to be a part of this patient's care.  Renda Rolls, PharmD, [facility] 09/30/2020 2:04 AM

## 2020-09-30 NOTE — ED Notes (Signed)
Pt given pain medicine as ordered.

## 2020-09-30 NOTE — ED Notes (Signed)
Patient ambulatory to bedside toilet.  °

## 2020-09-30 NOTE — ED Notes (Signed)
Pt resting with eyes closed. VSS. Resp non labored and equal. Call light within reach. Will continue to monitor.

## 2020-09-30 NOTE — ED Notes (Signed)
Report received from [facility] RN. Patient care assumed. Patient/RN introduction complete. Will continue to monitor.

## 2020-09-30 NOTE — Progress Notes (Signed)
PROGRESS NOTE  John Wolfe VQQ:[id number] DOB: 1985-12-26 DOA: 09/29/2020 PCP: Patient, No Pcp Per (Inactive)   LOS: 1 day   Brief narrative:  John Wolfe is a 35 y.o. male with past medical history of bipolar disorder presented to the hospital with right sided dental pain and swelling 2.1x 1.6 x 1.8 cm gas and fluid containing abscess along the inner margin of the right mandible. This was noted to be of odontogenic origin with extensive inflammatory changes causing displacement of right submandibular gland among other findings.  ED provider had spoken with ENT who recommended Unasyn and Decadron and the patient was admitted to hospital.   Assessment/Plan:  Principal Problem:   Abscess of right submandibular region Active Problems:   Elevated lactic acid level  Abscess of right submandibular region secondary to dental caries. CT scan showed submandibular abscess which has improved after Unasyn and IV Decadron.  ENT has seen the patient.  We will continue current level of treatment.  Was on full liquids.  Will be started on soft diet.    COVID-positive.  Asymptomatic.    Elevated lactic acid level Lactate elevated at 2.7.  DVT prophylaxis: SCDs Start: 09/29/20 2102   Code Status: Full code  Family Communication: None  Status is: Inpatient  Remains inpatient appropriate because:IV treatments appropriate due to intensity of illness or inability to take PO and Inpatient level of care appropriate due to severity of illness  Dispo: The patient is from: Home              Anticipated d/c is to: Home              Patient currently is not medically stable to d/c.   Difficult to place patient No  Consultants: ENT  Procedures: None  Anti-infectives:  Unasyn  Anti-infectives (From admission, onward)    Start     Dose/Rate Route Frequency Ordered Stop   09/30/20 0200  Ampicillin-Sulbactam (UNASYN) 3 g in sodium chloride 0.9 % 100 mL IVPB        3 g 200  mL/hr over 30 Minutes Intravenous Every 6 hours 09/30/20 0157     09/29/20 2015  Ampicillin-Sulbactam (UNASYN) 3 g in sodium chloride 0.9 % 100 mL IVPB        3 g 200 mL/hr over 30 Minutes Intravenous  Once 09/29/20 2003 09/29/20 2104       Subjective: Today, patient was seen and examined at bedside.  Patient complains of decreasing swelling of the right submandibular area.  No fever or chills.  Feels much better today.  No shortness of breath, cough  Objective: Vitals:   09/30/20 0730 09/30/20 0901  BP: 132/90 132/90  Pulse: (!) 58 (!) 58  Resp:  16  Temp:  98.7 F (37.1 C)  SpO2: 100% 100%    Intake/Output Summary (Last 24 hours) at 09/30/2020 1119 Last data filed at 09/30/2020 1018 Gross per 24 hour  Intake 100 ml  Output --  Net 100 ml   Filed Weights   09/30/20 0141  Weight: 74.8 kg   Body mass index is 23.68 kg/m.   Physical Exam: GENERAL: Patient is alert awake and oriented. Not in obvious distress. HENT: No scleral pallor or icterus. Pupils equally reactive to light. Oral mucosa is moist.  Carious tooth. NECK: is supple, right submandibular swelling with mild tenderness.  Trismus has improved. CHEST: Clear to auscultation. No crackles or wheezes.  Diminished breath sounds bilaterally. CVS: S1 and S2 heard, no murmur.  Regular rate and rhythm.  ABDOMEN: Soft, non-tender, bowel sounds are present. EXTREMITIES: No edema. CNS: Cranial nerves are intact. No focal motor deficits. SKIN: warm and dry without rashes.  Data Review: I have personally reviewed the following laboratory data and studies,  CBC: Recent Labs  Lab 09/29/20 1759  WBC 9.6  HGB 15.5  HCT 44.7  MCV 85.6  PLT 093   Basic Metabolic Panel: Recent Labs  Lab 09/29/20 1759  NA 137  K 3.9  CL 100  CO2 30  GLUCOSE 100*  BUN 8  CREATININE 0.92  CALCIUM 9.0   Liver Function Tests: No results for input(s): AST, ALT, ALKPHOS, BILITOT, PROT, ALBUMIN in the last 168 hours. No results for  input(s): LIPASE, AMYLASE in the last 168 hours. No results for input(s): AMMONIA in the last 168 hours. Cardiac Enzymes: No results for input(s): CKTOTAL, CKMB, CKMBINDEX, TROPONINI in the last 168 hours. BNP (last 3 results) No results for input(s): BNP in the last 8760 hours.  ProBNP (last 3 results) No results for input(s): PROBNP in the last 8760 hours.  CBG: No results for input(s): GLUCAP in the last 168 hours. Recent Results (from the past 240 hour(s))  Resp Panel by RT-PCR (Flu A&B, Covid) Nasopharyngeal Swab     Status: Abnormal   Collection Time: 09/29/20  8:35 PM   Specimen: Nasopharyngeal Swab; Nasopharyngeal(NP) swabs in vial transport medium  Result Value Ref Range Status   SARS Coronavirus 2 by RT PCR POSITIVE (A) NEGATIVE Final    Comment: RESULT CALLED TO, READ BACK BY AND VERIFIED WITH: AMY COYNE RN 2235 09/29/20 hnm (NOTE) SARS-CoV-2 target nucleic acids are DETECTED.  The SARS-CoV-2 RNA is generally detectable in upper respiratory specimens during the acute phase of infection. Positive results are indicative of the presence of the identified virus, but do not rule out bacterial infection or co-infection with other pathogens not detected by the test. Clinical correlation with patient history and other diagnostic information is necessary to determine patient infection status. The expected result is Negative.  Fact Sheet for Patients: EntrepreneurPulse.com.au  Fact Sheet for Healthcare Providers: IncredibleEmployment.be  This test is not yet approved or cleared by the [place] FDA and  has been authorized for detection and/or diagnosis of SARS-CoV-2 by FDA under an Emergency Use Authorization (EUA).  This EUA will remain in effect (meaning this test can be use d) for the duration of  the COVID-19 declaration under Section 564(b)(1) of the Act, 21 U.S.C. section 360bbb-3(b)(1), unless the authorization is terminated or  revoked sooner.     Influenza A by PCR NEGATIVE NEGATIVE Final   Influenza B by PCR NEGATIVE NEGATIVE Final    Comment: (NOTE) The Xpert Xpress SARS-CoV-2/FLU/RSV plus assay is intended as an aid in the diagnosis of influenza from Nasopharyngeal swab specimens and should not be used as a sole basis for treatment. Nasal washings and aspirates are unacceptable for Xpert Xpress SARS-CoV-2/FLU/RSV testing.  Fact Sheet for Patients: EntrepreneurPulse.com.au  Fact Sheet for Healthcare Providers: IncredibleEmployment.be  This test is not yet approved or cleared by the [place] FDA and has been authorized for detection and/or diagnosis of SARS-CoV-2 by FDA under an Emergency Use Authorization (EUA). This EUA will remain in effect (meaning this test can be used) for the duration of the COVID-19 declaration under Section 564(b)(1) of the Act, 21 U.S.C. section 360bbb-3(b)(1), unless the authorization is terminated or revoked.  Performed at [facility], [address], [place], [place] [address]  Studies: CT Soft Tissue Neck W Contrast  Result Date: 09/29/2020 CLINICAL DATA:  Right submandibular swelling. Additional history provided: Patient reports right dental pain, submandibular soft tissue swelling, pain developed 1 week ago, but increasing over the last 5 days, low-grade fever. EXAM: CT NECK WITH CONTRAST TECHNIQUE: Multidetector CT imaging of the neck was performed using the standard protocol following the bolus administration of intravenous contrast. CONTRAST:  54mL OMNIPAQUE IOHEXOL 350 MG/ML SOLN COMPARISON:  No pertinent prior exams available for comparison. FINDINGS: Pharynx and larynx: Poor dentition with multiple carious teeth and multifocal periapical lucency. Notably, there is periapical lucency surrounding the right mandibular second premolar and right mandibular second molar. Subtle periapical lucency is also questioned  along the roots of the right mandibular third molar (series 5, image 22). There are prominent inflammatory changes and edema within the right greater than left floor of mouth and submandibular spaces. This includes a 2.1 x 1.6 x 1.8 cm gas and fluid containing abscess along the inner margin of the right mandible, in close proximity to the right mandibular first premolar and right mandibular molars (for instance as seen on series 6, image 31) (series 2, image 42). Mucosal/submucosal edema also appears to extend to the right oropharynx with mild effacement of the aerodigestive tract. The hypopharynx and larynx are unremarkable. Salivary glands: The right submandibular gland is posteriorly displaced by extensive edema and inflammatory changes within the right submandibular space. However, the right submandibular gland appears intrinsically unremarkable. The bilateral parotid and left submandibular glands are unremarkable. Thyroid: Subcentimeter right thyroid lobe nodule, not meeting consensus criteria for ultrasound follow-up based on size. Lymph nodes: Cervical lymphadenopathy, predominantly on the right, and likely reactive. Vascular: The major vascular structures of the neck are patent. Limited intracranial: No evidence of acute intracranial mallet within the field of view. Visualized orbits: Incompletely imaged. No mass or acute finding at the imaged levels. Mastoids and visualized paranasal sinuses: No significant paranasal sinus disease or mastoid effusion at the imaged levels. Skeleton: Reversal of the expected cervical lordosis. Upper chest: No consolidation within the imaged lung apices. 1.4 x 1.1 x 1.9 cm air-filled focus within the right paratracheal region, which may reflect a tracheal or esophageal diverticulum (for instance as seen on series 6, image 57) (series 2, image 85). These results were called by telephone at the time of interpretation on 09/29/2020 at 7:26 pm to provider Dr. Nickolas Madrid, who verbally  acknowledged these results. IMPRESSION: Prominent inflammatory changes and edema within the right greater than left floor of mouth, and submandibular spaces. This includes a 2.1 x 1.6 x 1.8 cm gas and fluid-containing abscess along the inner margin of the right mandible, in close proximity to the right mandibular first premolar and right mandibular molars. The abscess and floor of mouth/submandibular space infection is presumed odontogenic in origin given poor dentition with multifocal periapical lucencies. Notably, periapical lucency surrounds the right mandibular first premolar, right mandibular second molar and possibly the right mandibular third molar. Mucosal/submucosal edema also extends to the right oropharynx, with mild effacement of the airway at this level. Extensive inflammatory changes within the right submandibular space posteriorly displaces the right submandibular gland. Cervical lymphadenopathy, predominantly on the right, likely reactive. 1.9 cm air-filled focus within the right paratracheal region, which may reflect an incidental tracheal or esophageal diverticulum. Electronically Signed   By: Kellie Simmering D.O.   On: 09/29/2020 19:29      Flora Lipps, MD  [facility] 09/30/2020  If 7PM-7AM, please contact night-coverage

## 2020-10-01 MED ORDER — PANTOPRAZOLE SODIUM 40 MG PO TBEC
40.0000 mg | DELAYED_RELEASE_TABLET | Freq: Every day | ORAL | Status: DC
  Administered 2020-10-01 – 2020-10-02 (×2): 40 mg via ORAL
  Filled 2020-10-01 (×2): qty 1

## 2020-10-01 NOTE — Progress Notes (Signed)
PROGRESS NOTE  John Wolfe MEQ:[id number] DOB: 04-Mar-1985 DOA: 09/29/2020 PCP: Patient, No Pcp Per (Inactive)   LOS: 2 days   Brief narrative:  John Wolfe is a 35 y.o. male with past medical history of bipolar disorder presented to the hospital with right sided dental pain and swelling 2.1x 1.6 x 1.8 cm gas and fluid containing abscess along the inner margin of the right mandible. This was noted to be of odontogenic origin with extensive inflammatory changes causing displacement of right submandibular gland among other findings.  ED provider had spoken with ENT who recommended Unasyn and Decadron and the patient was admitted to hospital.    During hospitalization, patient was seen by ENT who recommended continuation of IV antibiotics for at least 48 hours followed by outpatient Decadron and antibiotic with outpatient dental follow-up.   Assessment/Plan:  Principal Problem:   Abscess of right submandibular region Active Problems:   Elevated lactic acid level  Abscess of right submandibular region secondary to dental caries. CT scan showed submandibular abscess , improved after Unasyn and IV Decadron.  ENT on board.  We will continue current level of treatment.  Okay with soft diet.  We will continue IV antibiotics to complete a 48 hours tonight.  Transition to p.o. antibiotic on discharge likely tomorrow.  HIV nonreactive.  COVID-positive.  Asymptomatic.  No respiratory symptoms or hypoxia.    Elevated lactic acid level Lactate elevated at 2.7.  Has trended down to 1.9 after IV fluid hydration.  DVT prophylaxis: SCDs Start: 09/29/20 2102   Code Status: Full code  Family Communication: None  Status is: Inpatient  Remains inpatient appropriate because:IV treatments appropriate due to intensity of illness or inability to take PO and Inpatient level of care appropriate due to severity of illness  Dispo: The patient is from: Home              Anticipated d/c is  to: Home likely tomorrow on 10/02/2020              Patient currently is not medically stable to d/c.   Difficult to place patient No  Consultants: ENT  Procedures: None  Anti-infectives:  Unasyn IV  Anti-infectives (From admission, onward)    Start     Dose/Rate Route Frequency Ordered Stop   09/30/20 0200  Ampicillin-Sulbactam (UNASYN) 3 g in sodium chloride 0.9 % 100 mL IVPB        3 g 200 mL/hr over 30 Minutes Intravenous Every 6 hours 09/30/20 0157     09/29/20 2015  Ampicillin-Sulbactam (UNASYN) 3 g in sodium chloride 0.9 % 100 mL IVPB        3 g 200 mL/hr over 30 Minutes Intravenous  Once 09/29/20 2003 09/29/20 2104       Subjective: Today, patient was seen and examined at bedside.  Has improved overall with decreasing pain and swelling.  Objective: Vitals:   09/30/20 2035 10/01/20 0440  BP: 119/73 121/70  Pulse: 66 (!) 58  Resp: 16 16  Temp: 98.3 F (36.8 C) 97.8 F (36.6 C)  SpO2: 99% 99%    Intake/Output Summary (Last 24 hours) at 10/01/2020 1114 Last data filed at 10/01/2020 0534 Gross per 24 hour  Intake 381.84 ml  Output 650 ml  Net -268.16 ml    Filed Weights   09/30/20 0141  Weight: 74.8 kg   Body mass index is 23.68 kg/m.   Physical Exam: GENERAL: Patient is alert awake and oriented. Not in obvious distress. HENT:  No scleral pallor or icterus. Pupils equally reactive to light. Oral mucosa is moist.  Carious tooth. NECK: is supple, right submandibular area induration with mild tenderness.  Able to open mouth.  Carious tooth.   CHEST: Clear to auscultation. No crackles or wheezes.  Diminished breath sounds bilaterally. CVS: S1 and S2 heard, no murmur. Regular rate and rhythm.  ABDOMEN: Soft, non-tender, bowel sounds are present. EXTREMITIES: No edema. CNS: Cranial nerves are intact. No focal motor deficits. SKIN: warm and dry without rashes.  Data Review: I have personally reviewed the following laboratory data and  studies,  CBC: Recent Labs  Lab 09/29/20 1759  WBC 9.6  HGB 15.5  HCT 44.7  MCV 85.6  PLT 409    Basic Metabolic Panel: Recent Labs  Lab 09/29/20 1759  NA 137  K 3.9  CL 100  CO2 30  GLUCOSE 100*  BUN 8  CREATININE 0.92  CALCIUM 9.0    Liver Function Tests: No results for input(s): AST, ALT, ALKPHOS, BILITOT, PROT, ALBUMIN in the last 168 hours. No results for input(s): LIPASE, AMYLASE in the last 168 hours. No results for input(s): AMMONIA in the last 168 hours. Cardiac Enzymes: No results for input(s): CKTOTAL, CKMB, CKMBINDEX, TROPONINI in the last 168 hours. BNP (last 3 results) No results for input(s): BNP in the last 8760 hours.  ProBNP (last 3 results) No results for input(s): PROBNP in the last 8760 hours.  CBG: No results for input(s): GLUCAP in the last 168 hours. Recent Results (from the past 240 hour(s))  Resp Panel by RT-PCR (Flu A&B, Covid) Nasopharyngeal Swab     Status: Abnormal   Collection Time: 09/29/20  8:35 PM   Specimen: Nasopharyngeal Swab; Nasopharyngeal(NP) swabs in vial transport medium  Result Value Ref Range Status   SARS Coronavirus 2 by RT PCR POSITIVE (A) NEGATIVE Final    Comment: RESULT CALLED TO, READ BACK BY AND VERIFIED WITH: AMY COYNE RN 2235 09/29/20 hnm (NOTE) SARS-CoV-2 target nucleic acids are DETECTED.  The SARS-CoV-2 RNA is generally detectable in upper respiratory specimens during the acute phase of infection. Positive results are indicative of the presence of the identified virus, but do not rule out bacterial infection or co-infection with other pathogens not detected by the test. Clinical correlation with patient history and other diagnostic information is necessary to determine patient infection status. The expected result is Negative.  Fact Sheet for Patients: EntrepreneurPulse.com.au  Fact Sheet for Healthcare Providers: IncredibleEmployment.be  This test is not yet  approved or cleared by the [place] FDA and  has been authorized for detection and/or diagnosis of SARS-CoV-2 by FDA under an Emergency Use Authorization (EUA).  This EUA will remain in effect (meaning this test can be use d) for the duration of  the COVID-19 declaration under Section 564(b)(1) of the Act, 21 U.S.C. section 360bbb-3(b)(1), unless the authorization is terminated or revoked sooner.     Influenza A by PCR NEGATIVE NEGATIVE Final   Influenza B by PCR NEGATIVE NEGATIVE Final    Comment: (NOTE) The Xpert Xpress SARS-CoV-2/FLU/RSV plus assay is intended as an aid in the diagnosis of influenza from Nasopharyngeal swab specimens and should not be used as a sole basis for treatment. Nasal washings and aspirates are unacceptable for Xpert Xpress SARS-CoV-2/FLU/RSV testing.  Fact Sheet for Patients: EntrepreneurPulse.com.au  Fact Sheet for Healthcare Providers: IncredibleEmployment.be  This test is not yet approved or cleared by the [place] FDA and has been authorized for detection and/or diagnosis of  SARS-CoV-2 by FDA under an Emergency Use Authorization (EUA). This EUA will remain in effect (meaning this test can be used) for the duration of the COVID-19 declaration under Section 564(b)(1) of the Act, 21 U.S.C. section 360bbb-3(b)(1), unless the authorization is terminated or revoked.  Performed at [facility], [address] [place], [place] [address]       Studies: CT Soft Tissue Neck W Contrast  Result Date: 09/29/2020 CLINICAL DATA:  Right submandibular swelling. Additional history provided: Patient reports right dental pain, submandibular soft tissue swelling, pain developed 1 week ago, but increasing over the last 5 days, low-grade fever. EXAM: CT NECK WITH CONTRAST TECHNIQUE: Multidetector CT imaging of the neck was performed using the standard protocol following the bolus administration of intravenous  contrast. CONTRAST:  76mL OMNIPAQUE IOHEXOL 350 MG/ML SOLN COMPARISON:  No pertinent prior exams available for comparison. FINDINGS: Pharynx and larynx: Poor dentition with multiple carious teeth and multifocal periapical lucency. Notably, there is periapical lucency surrounding the right mandibular second premolar and right mandibular second molar. Subtle periapical lucency is also questioned along the roots of the right mandibular third molar (series 5, image 22). There are prominent inflammatory changes and edema within the right greater than left floor of mouth and submandibular spaces. This includes a 2.1 x 1.6 x 1.8 cm gas and fluid containing abscess along the inner margin of the right mandible, in close proximity to the right mandibular first premolar and right mandibular molars (for instance as seen on series 6, image 31) (series 2, image 42). Mucosal/submucosal edema also appears to extend to the right oropharynx with mild effacement of the aerodigestive tract. The hypopharynx and larynx are unremarkable. Salivary glands: The right submandibular gland is posteriorly displaced by extensive edema and inflammatory changes within the right submandibular space. However, the right submandibular gland appears intrinsically unremarkable. The bilateral parotid and left submandibular glands are unremarkable. Thyroid: Subcentimeter right thyroid lobe nodule, not meeting consensus criteria for ultrasound follow-up based on size. Lymph nodes: Cervical lymphadenopathy, predominantly on the right, and likely reactive. Vascular: The major vascular structures of the neck are patent. Limited intracranial: No evidence of acute intracranial mallet within the field of view. Visualized orbits: Incompletely imaged. No mass or acute finding at the imaged levels. Mastoids and visualized paranasal sinuses: No significant paranasal sinus disease or mastoid effusion at the imaged levels. Skeleton: Reversal of the expected cervical  lordosis. Upper chest: No consolidation within the imaged lung apices. 1.4 x 1.1 x 1.9 cm air-filled focus within the right paratracheal region, which may reflect a tracheal or esophageal diverticulum (for instance as seen on series 6, image 57) (series 2, image 85). These results were called by telephone at the time of interpretation on 09/29/2020 at 7:26 pm to provider Dr. Nickolas Madrid, who verbally acknowledged these results. IMPRESSION: Prominent inflammatory changes and edema within the right greater than left floor of mouth, and submandibular spaces. This includes a 2.1 x 1.6 x 1.8 cm gas and fluid-containing abscess along the inner margin of the right mandible, in close proximity to the right mandibular first premolar and right mandibular molars. The abscess and floor of mouth/submandibular space infection is presumed odontogenic in origin given poor dentition with multifocal periapical lucencies. Notably, periapical lucency surrounds the right mandibular first premolar, right mandibular second molar and possibly the right mandibular third molar. Mucosal/submucosal edema also extends to the right oropharynx, with mild effacement of the airway at this level. Extensive inflammatory changes within the right submandibular space posteriorly  displaces the right submandibular gland. Cervical lymphadenopathy, predominantly on the right, likely reactive. 1.9 cm air-filled focus within the right paratracheal region, which may reflect an incidental tracheal or esophageal diverticulum. Electronically Signed   By: Kellie Simmering D.O.   On: 09/29/2020 19:29      Flora Lipps, MD  [facility] 10/01/2020  If 7PM-7AM, please contact night-coverage

## 2020-10-02 LAB — CBC
HCT: 36 % — ABNORMAL LOW (ref 39.0–52.0)
Hemoglobin: 12.4 g/dL — ABNORMAL LOW (ref 13.0–17.0)
MCH: 30.2 pg (ref 26.0–34.0)
MCHC: 34.4 g/dL (ref 30.0–36.0)
MCV: 87.6 fL (ref 80.0–100.0)
Platelets: 301 10*3/uL (ref 150–400)
RBC: 4.11 MIL/uL — ABNORMAL LOW (ref 4.22–5.81)
RDW: 13.9 % (ref 11.5–15.5)
WBC: 15.2 10*3/uL — ABNORMAL HIGH (ref 4.0–10.5)
nRBC: 0 % (ref 0.0–0.2)

## 2020-10-02 LAB — BASIC METABOLIC PANEL
Anion gap: 10 (ref 5–15)
BUN: 10 mg/dL (ref 6–20)
CO2: 27 mmol/L (ref 22–32)
Calcium: 8.7 mg/dL — ABNORMAL LOW (ref 8.9–10.3)
Chloride: 104 mmol/L (ref 98–111)
Creatinine, Ser: 0.63 mg/dL (ref 0.61–1.24)
GFR, Estimated: >60 mL/min (ref >60)
Glucose, Bld: 126 mg/dL — ABNORMAL HIGH (ref 70–99)
Potassium: 4.3 mmol/L (ref 3.5–5.1)
Sodium: 141 mmol/L (ref 135–145)

## 2020-10-02 MED ORDER — CHLORHEXIDINE GLUCONATE 0.12 % MT SOLN: 15.0000 mL | Freq: Four times a day (QID) | OROMUCOSAL | 1 refills | Status: AC

## 2020-10-02 MED ORDER — TRAZODONE HCL 100 MG PO TABS: 100.0000 mg | ORAL_TABLET | Freq: Every evening | ORAL | 1 refills | Status: AC | PRN

## 2020-10-02 MED ORDER — AMOXICILLIN-POT CLAVULANATE 875-125 MG PO TABS: 1.0000 | ORAL_TABLET | Freq: Two times a day (BID) | ORAL | 0 refills | Status: AC

## 2020-10-02 MED ORDER — PREDNISONE 10 MG PO TABS: ORAL_TABLET | ORAL | 0 refills | Status: AC

## 2020-10-02 MED ORDER — IBUPROFEN 600 MG PO TABS: 600.0000 mg | ORAL_TABLET | Freq: Three times a day (TID) | ORAL | 1 refills | Status: AC | PRN

## 2020-10-02 MED ORDER — PANTOPRAZOLE SODIUM 40 MG PO TBEC: 40.0000 mg | DELAYED_RELEASE_TABLET | Freq: Every day | ORAL | 0 refills | Status: AC

## 2020-10-02 MED ORDER — OXCARBAZEPINE 300 MG PO TABS: 300.0000 mg | ORAL_TABLET | Freq: Two times a day (BID) | ORAL | 1 refills | Status: AC

## 2020-10-02 MED ORDER — NICOTINE 21 MG/24HR TD PT24: 21.0000 mg | MEDICATED_PATCH | Freq: Every day | TRANSDERMAL | 0 refills | Status: AC

## 2020-10-02 MED ORDER — QUETIAPINE FUMARATE 100 MG PO TABS: 100.0000 mg | ORAL_TABLET | Freq: Every day | ORAL | 1 refills | Status: AC

## 2020-10-02 NOTE — Discharge Summary (Signed)
Physician Discharge Summary  [name] YIA:[id number] DOB: 1985/01/28 DOA: 09/29/2020  PCP: Patient, No Pcp Per (Inactive)  Admit date: 09/29/2020 Discharge date: 10/02/2020  Admitted From: Home  Discharge disposition: Home   Recommendations for Outpatient Follow-Up:   Follow up with your primary care provider in one week.  Check CBC, BMP, magnesium in the next visit Follow up with dentist as soon as possible.  Discharge Diagnosis:   Principal Problem:   Abscess of right submandibular region Active Problems:   Elevated lactic acid level   Discharge Condition: Improved.  Diet recommendation: soft diet.  Wound care: None.  Code status: Full.   History of Present Illness:   John Wolfe is a 35 y.o. male with past medical history of bipolar disorder presented to the hospital with right sided dental pain and swelling 2.1x 1.6 x 1.8 cm gas and fluid containing abscess along the inner margin of the right mandible. This was noted to be of odontogenic origin with extensive inflammatory changes causing displacement of right submandibular gland among other findings.  Patient was then admitted to hospital for IV antibiotics and ENT consultation.    Hospital Course:   Following conditions were addressed during hospitalization as listed below,   Abscess of right submandibular region secondary to dental caries. CT scan showed submandibular abscess , improved after Unasyn and IV Decadron.  ENT followed the patient during hospitalization.  Will be transition to Augmentin discharge including prednisone taper as recommended by ENT for 10 and 12 days respectively. HIV nonreactive.  He was encouraged to rinse his mouth with chlorhexidine on a regular basis.   COVID-positive.  Asymptomatic.  No respiratory symptoms or hypoxia.  Total isolation recommended at 10 days per    Elevated lactic acid level Lactate was elevated at 2.7 presentation.  Has trended down to 1.9  after IV fluid hydration.  Disposition.  At this time, patient is stable for disposition home with outpatient PCP and dentist follow-up.  Medical Consultants:   ENT  Procedures:    None Subjective:   Today, patient was seen and examined at bedside.  Patient continues to feel better.  Denies any fever chills overt pain.  Discharge Exam:   Vitals:   10/02/20 0445 10/02/20 0843  BP: 124/75 (!) 131/95  Pulse: 87 66  Resp: 19 18  Temp: 97.7 F (36.5 C) 98.2 F (36.8 C)  SpO2: 99% 98%   Vitals:   10/01/20 0440 10/01/20 2103 10/02/20 0445 10/02/20 0843  BP: 121/70 (!) 146/75 124/75 (!) 131/95  Pulse: (!) 58 89 87 66  Resp: 16 16 19 18   Temp: 97.8 F (36.6 C) 97.6 F (36.4 C) 97.7 F (36.5 C) 98.2 F (36.8 C)  TempSrc: Oral Oral Oral Oral  SpO2: 99% 100% 99% 98%  Weight:      Height:       General: Alert awake, not in obvious distress HENT: pupils equally reacting to light,  No scleral pallor or icterus noted. Oral mucosa is moist.  Carious tooth.  Right submandibular swelling and induration. Chest:  Clear breath sounds.  Diminished breath sounds bilaterally. No crackles or wheezes.  CVS: S1 &S2 heard. No murmur.  Regular rate and rhythm. Abdomen: Soft, nontender, nondistended.  Bowel sounds are heard.   Extremities: No cyanosis, clubbing or edema.  Peripheral pulses are palpable. Psych: Alert, awake and oriented, elevated mood. CNS:  No cranial nerve deficits.  Power equal in all extremities.   Skin: Warm and dry.  No  rashes noted.  The results of significant diagnostics from this hospitalization (including imaging, microbiology, ancillary and laboratory) are listed below for reference.     Diagnostic Studies:   CT Soft Tissue Neck W Contrast  Result Date: 09/29/2020 CLINICAL DATA:  Right submandibular swelling. Additional history provided: Patient reports right dental pain, submandibular soft tissue swelling, pain developed 1 week ago, but increasing over the  last 5 days, low-grade fever. EXAM: CT NECK WITH CONTRAST TECHNIQUE: Multidetector CT imaging of the neck was performed using the standard protocol following the bolus administration of intravenous contrast. CONTRAST:  48mL OMNIPAQUE IOHEXOL 350 MG/ML SOLN COMPARISON:  No pertinent prior exams available for comparison. FINDINGS: Pharynx and larynx: Poor dentition with multiple carious teeth and multifocal periapical lucency. Notably, there is periapical lucency surrounding the right mandibular second premolar and right mandibular second molar. Subtle periapical lucency is also questioned along the roots of the right mandibular third molar (series 5, image 22). There are prominent inflammatory changes and edema within the right greater than left floor of mouth and submandibular spaces. This includes a 2.1 x 1.6 x 1.8 cm gas and fluid containing abscess along the inner margin of the right mandible, in close proximity to the right mandibular first premolar and right mandibular molars (for instance as seen on series 6, image 31) (series 2, image 42). Mucosal/submucosal edema also appears to extend to the right oropharynx with mild effacement of the aerodigestive tract. The hypopharynx and larynx are unremarkable. Salivary glands: The right submandibular gland is posteriorly displaced by extensive edema and inflammatory changes within the right submandibular space. However, the right submandibular gland appears intrinsically unremarkable. The bilateral parotid and left submandibular glands are unremarkable. Thyroid: Subcentimeter right thyroid lobe nodule, not meeting consensus criteria for ultrasound follow-up based on size. Lymph nodes: Cervical lymphadenopathy, predominantly on the right, and likely reactive. Vascular: The major vascular structures of the neck are patent. Limited intracranial: No evidence of acute intracranial mallet within the field of view. Visualized orbits: Incompletely imaged. No mass or acute  finding at the imaged levels. Mastoids and visualized paranasal sinuses: No significant paranasal sinus disease or mastoid effusion at the imaged levels. Skeleton: Reversal of the expected cervical lordosis. Upper chest: No consolidation within the imaged lung apices. 1.4 x 1.1 x 1.9 cm air-filled focus within the right paratracheal region, which may reflect a tracheal or esophageal diverticulum (for instance as seen on series 6, image 57) (series 2, image 85). These results were called by telephone at the time of interpretation on 09/29/2020 at 7:26 pm to provider Dr. Nickolas Madrid, who verbally acknowledged these results. IMPRESSION: Prominent inflammatory changes and edema within the right greater than left floor of mouth, and submandibular spaces. This includes a 2.1 x 1.6 x 1.8 cm gas and fluid-containing abscess along the inner margin of the right mandible, in close proximity to the right mandibular first premolar and right mandibular molars. The abscess and floor of mouth/submandibular space infection is presumed odontogenic in origin given poor dentition with multifocal periapical lucencies. Notably, periapical lucency surrounds the right mandibular first premolar, right mandibular second molar and possibly the right mandibular third molar. Mucosal/submucosal edema also extends to the right oropharynx, with mild effacement of the airway at this level. Extensive inflammatory changes within the right submandibular space posteriorly displaces the right submandibular gland. Cervical lymphadenopathy, predominantly on the right, likely reactive. 1.9 cm air-filled focus within the right paratracheal region, which may reflect an incidental tracheal or esophageal diverticulum. Electronically Signed  By: Kellie Simmering D.O.   On: 09/29/2020 19:29     Labs:   Basic Metabolic Panel: Recent Labs  Lab 09/29/20 1759 10/02/20 0703  NA 137 141  K 3.9 4.3  CL 100 104  CO2 30 27  GLUCOSE 100* 126*  BUN 8 10  CREATININE  0.92 0.63  CALCIUM 9.0 8.7*    GFR Estimated Creatinine Clearance: 133.1 mL/min (by C-G formula based on SCr of 0.63 mg/dL). Liver Function Tests: No results for input(s): AST, ALT, ALKPHOS, BILITOT, PROT, ALBUMIN in the last 168 hours. No results for input(s): LIPASE, AMYLASE in the last 168 hours. No results for input(s): AMMONIA in the last 168 hours. Coagulation profile No results for input(s): INR, PROTIME in the last 168 hours.  CBC: Recent Labs  Lab 09/29/20 1759 10/02/20 0703  WBC 9.6 15.2*  HGB 15.5 12.4*  HCT 44.7 36.0*  MCV 85.6 87.6  PLT 239 301    Cardiac Enzymes: No results for input(s): CKTOTAL, CKMB, CKMBINDEX, TROPONINI in the last 168 hours. BNP: Invalid input(s): POCBNP CBG: No results for input(s): GLUCAP in the last 168 hours. D-Dimer No results for input(s): DDIMER in the last 72 hours. Hgb A1c No results for input(s): HGBA1C in the last 72 hours. Lipid Profile No results for input(s): CHOL, HDL, LDLCALC, TRIG, CHOLHDL, LDLDIRECT in the last 72 hours. Thyroid function studies No results for input(s): TSH, T4TOTAL, T3FREE, THYROIDAB in the last 72 hours.  Invalid input(s): FREET3 Anemia work up No results for input(s): VITAMINB12, FOLATE, FERRITIN, TIBC, IRON, RETICCTPCT in the last 72 hours. Microbiology Recent Results (from the past 240 hour(s))  Resp Panel by RT-PCR (Flu A&B, Covid) Nasopharyngeal Swab     Status: Abnormal   Collection Time: 09/29/20  8:35 PM   Specimen: Nasopharyngeal Swab; Nasopharyngeal(NP) swabs in vial transport medium  Result Value Ref Range Status   SARS Coronavirus 2 by RT PCR POSITIVE (A) NEGATIVE Final    Comment: RESULT CALLED TO, READ BACK BY AND VERIFIED WITH: AMY COYNE RN 2235 09/29/20 hnm (NOTE) SARS-CoV-2 target nucleic acids are DETECTED.  The SARS-CoV-2 RNA is generally detectable in upper respiratory specimens during the acute phase of infection. Positive results are indicative of the presence of the  identified virus, but do not rule out bacterial infection or co-infection with other pathogens not detected by the test. Clinical correlation with patient history and other diagnostic information is necessary to determine patient infection status. The expected result is Negative.  Fact Sheet for Patients: EntrepreneurPulse.com.au  Fact Sheet for Healthcare Providers: IncredibleEmployment.be  This test is not yet approved or cleared by the [place] FDA and  has been authorized for detection and/or diagnosis of SARS-CoV-2 by FDA under an Emergency Use Authorization (EUA).  This EUA will remain in effect (meaning this test can be use d) for the duration of  the COVID-19 declaration under Section 564(b)(1) of the Act, 21 U.S.C. section 360bbb-3(b)(1), unless the authorization is terminated or revoked sooner.     Influenza A by PCR NEGATIVE NEGATIVE Final   Influenza B by PCR NEGATIVE NEGATIVE Final    Comment: (NOTE) The Xpert Xpress SARS-CoV-2/FLU/RSV plus assay is intended as an aid in the diagnosis of influenza from Nasopharyngeal swab specimens and should not be used as a sole basis for treatment. Nasal washings and aspirates are unacceptable for Xpert Xpress SARS-CoV-2/FLU/RSV testing.  Fact Sheet for Patients: EntrepreneurPulse.com.au  Fact Sheet for Healthcare Providers: IncredibleEmployment.be  This test is not yet approved or  cleared by the [place] and has been authorized for detection and/or diagnosis of SARS-CoV-2 by FDA under an Emergency Use Authorization (EUA). This EUA will remain in effect (meaning this test can be used) for the duration of the COVID-19 declaration under Section 564(b)(1) of the Act, 21 U.S.C. section 360bbb-3(b)(1), unless the authorization is terminated or revoked.  Performed at [facility], [address] [place], [place] [address]       Discharge Instructions:   Discharge Instructions     Diet general   Complete by: As directed    Soft diet   Discharge instructions   Complete by: As directed    Continue medications to complete the course. Follow up with dentist as soon as possible to take care of your infected tooth.  Seek medical attention for worsening symptoms.  Follow-up with your primary care physician in 1 week.  Continue to take your medications from home.   Increase activity slowly   Complete by: As directed       Allergies as of 10/02/2020   No Known Allergies      Medication List     TAKE these medications    amoxicillin-clavulanate 875-125 MG tablet Commonly known as: Augmentin Take 1 tablet by mouth 2 (two) times daily for 10 days.   chlorhexidine 0.12 % solution Commonly known as: PERIDEX Use as directed 15 mLs in the mouth or throat 4 (four) times daily.   ibuprofen 600 MG tablet Commonly known as: ADVIL Take 1 tablet (600 mg total) by mouth every 8 (eight) hours as needed.   nicotine 21 mg/24hr patch Commonly known as: NICODERM CQ - dosed in mg/24 hours Place 1 patch (21 mg total) onto the skin daily.   Oxcarbazepine 300 MG tablet Commonly known as: TRILEPTAL Take 1 tablet (300 mg total) by mouth 2 (two) times daily.   pantoprazole 40 MG tablet Commonly known as: PROTONIX Take 1 tablet (40 mg total) by mouth daily.   predniSONE 10 MG tablet Commonly known as: DELTASONE Take 4 tablets (40 mg total) by mouth daily for 3 days, THEN 3 tablets (30 mg total) daily for 3 days, THEN 2 tablets (20 mg total) daily for 3 days, THEN 1 tablet (10 mg total) daily for 3 days. Start taking on: October 02, 2020   QUEtiapine 100 MG tablet Commonly known as: SEROQUEL Take 1 tablet (100 mg total) by mouth at bedtime.   traZODone 100 MG tablet Commonly known as: DESYREL Take 1 tablet (100 mg total) by mouth at bedtime as needed for sleep.        Follow-up Information     Primary  care provider. Schedule an appointment as soon as possible for a visit in 1 week(s).   Why: Patient to make follow up appointment for one week from discharge                 Time coordinating discharge: 39 minutes  Signed:  Jere Vanburen  [facility] 10/02/2020, 10:21 AM

## 2022-06-13 ENCOUNTER — Other Ambulatory Visit: Payer: Self-pay
